# Patient Record
Sex: Male | Born: 1945
Health system: Southern US, Community
[De-identification: ages and names within clinical notes are randomized; demographics above are authoritative.]

## PROBLEM LIST (undated history)

## (undated) DIAGNOSIS — R972 Elevated prostate specific antigen [PSA]: Secondary | ICD-10-CM

## (undated) DIAGNOSIS — N400 Enlarged prostate without lower urinary tract symptoms: Secondary | ICD-10-CM

## (undated) DIAGNOSIS — Z8709 Personal history of other diseases of the respiratory system: Secondary | ICD-10-CM

## (undated) DIAGNOSIS — I2510 Atherosclerotic heart disease of native coronary artery without angina pectoris: Secondary | ICD-10-CM

## (undated) DIAGNOSIS — E785 Hyperlipidemia, unspecified: Secondary | ICD-10-CM

## (undated) DIAGNOSIS — N189 Chronic kidney disease, unspecified: Secondary | ICD-10-CM

## (undated) DIAGNOSIS — R7989 Other specified abnormal findings of blood chemistry: Secondary | ICD-10-CM

## (undated) DIAGNOSIS — I10 Essential (primary) hypertension: Secondary | ICD-10-CM

## (undated) DIAGNOSIS — N183 Chronic kidney disease, stage 3 unspecified: Secondary | ICD-10-CM

## (undated) DIAGNOSIS — I639 Cerebral infarction, unspecified: Secondary | ICD-10-CM

## (undated) HISTORY — DX: Chronic kidney disease, unspecified: N18.9

## (undated) HISTORY — DX: Cerebral infarction, unspecified: I63.9

## (undated) HISTORY — DX: Essential (primary) hypertension: I10

## (undated) HISTORY — DX: Other specified abnormal findings of blood chemistry: R79.89

## (undated) HISTORY — DX: Chronic kidney disease, stage 3 unspecified: N18.30

## (undated) HISTORY — DX: Atherosclerotic heart disease of native coronary artery without angina pectoris: I25.10

## (undated) HISTORY — PX: CORONARY ARTERY BYPASS GRAFT: SHX141

## (undated) HISTORY — DX: Elevated prostate specific antigen (PSA): R97.20

## (undated) HISTORY — DX: Hyperlipidemia, unspecified: E78.5

## (undated) HISTORY — DX: Personal history of other diseases of the respiratory system: Z87.09

## (undated) HISTORY — DX: Benign prostatic hyperplasia without lower urinary tract symptoms: N40.0

---

## 2009-02-08 HISTORY — PX: OTHER SURGICAL HISTORY: SHX169

## 2009-11-06 ENCOUNTER — Ambulatory Visit: Payer: Self-pay | Admitting: Thoracic Surgery (Cardiothoracic Vascular Surgery)

## 2009-11-06 ENCOUNTER — Inpatient Hospital Stay (HOSPITAL_COMMUNITY)
Admission: AD | Admit: 2009-11-06 | Discharge: 2009-11-11 | Payer: Self-pay | Source: Home / Self Care | Admitting: Cardiology

## 2009-11-06 ENCOUNTER — Encounter: Payer: Self-pay | Admitting: Cardiology

## 2009-11-06 ENCOUNTER — Encounter: Payer: Self-pay | Admitting: Thoracic Surgery (Cardiothoracic Vascular Surgery)

## 2009-11-06 ENCOUNTER — Ambulatory Visit: Payer: Self-pay | Admitting: Cardiology

## 2009-11-06 DIAGNOSIS — I10 Essential (primary) hypertension: Secondary | ICD-10-CM | POA: Insufficient documentation

## 2009-11-06 DIAGNOSIS — R079 Chest pain, unspecified: Secondary | ICD-10-CM | POA: Insufficient documentation

## 2009-11-06 DIAGNOSIS — F17200 Nicotine dependence, unspecified, uncomplicated: Secondary | ICD-10-CM | POA: Insufficient documentation

## 2009-11-06 DIAGNOSIS — E785 Hyperlipidemia, unspecified: Secondary | ICD-10-CM | POA: Insufficient documentation

## 2009-11-06 HISTORY — DX: Chest pain, unspecified: R07.9

## 2009-12-01 ENCOUNTER — Ambulatory Visit: Payer: Self-pay | Admitting: Thoracic Surgery (Cardiothoracic Vascular Surgery)

## 2009-12-01 ENCOUNTER — Encounter
Admission: RE | Admit: 2009-12-01 | Discharge: 2009-12-01 | Payer: Self-pay | Admitting: Thoracic Surgery (Cardiothoracic Vascular Surgery)

## 2009-12-01 ENCOUNTER — Encounter: Payer: Self-pay | Admitting: Cardiology

## 2009-12-09 ENCOUNTER — Ambulatory Visit: Payer: Self-pay | Admitting: Cardiology

## 2009-12-09 DIAGNOSIS — R5383 Other fatigue: Secondary | ICD-10-CM | POA: Insufficient documentation

## 2009-12-09 DIAGNOSIS — I25118 Atherosclerotic heart disease of native coronary artery with other forms of angina pectoris: Secondary | ICD-10-CM | POA: Insufficient documentation

## 2009-12-09 DIAGNOSIS — I2510 Atherosclerotic heart disease of native coronary artery without angina pectoris: Secondary | ICD-10-CM | POA: Insufficient documentation

## 2009-12-09 HISTORY — DX: Other fatigue: R53.83

## 2009-12-10 ENCOUNTER — Encounter: Payer: Self-pay | Admitting: Cardiology

## 2009-12-12 ENCOUNTER — Ambulatory Visit: Payer: Self-pay | Admitting: Internal Medicine

## 2009-12-17 ENCOUNTER — Encounter: Payer: Self-pay | Admitting: Cardiology

## 2009-12-17 ENCOUNTER — Ambulatory Visit: Payer: Self-pay | Admitting: Cardiology

## 2009-12-23 LAB — CONVERTED CEMR LAB
ALT: 31 units/L (ref 0–53)
AST: 24 units/L (ref 0–37)
Albumin: 4.2 g/dL (ref 3.5–5.2)
Alkaline Phosphatase: 136 units/L — ABNORMAL HIGH (ref 39–117)
BUN: 20 mg/dL (ref 6–23)
Bilirubin, Direct: 0.1 mg/dL (ref 0.0–0.3)
CO2: 25 meq/L (ref 19–32)
Calcium: 9.5 mg/dL (ref 8.4–10.5)
Chloride: 104 meq/L (ref 96–112)
Creatinine, Ser: 1.06 mg/dL (ref 0.40–1.50)
Glucose, Bld: 72 mg/dL (ref 70–99)
Indirect Bilirubin: 0.6 mg/dL (ref 0.0–0.9)
Potassium: 4.3 meq/L (ref 3.5–5.3)
Sodium: 141 meq/L (ref 135–145)
Total Bilirubin: 0.7 mg/dL (ref 0.3–1.2)
Total CK: 36 units/L (ref 7–232)
Total Protein: 7.5 g/dL (ref 6.0–8.3)

## 2010-01-09 ENCOUNTER — Ambulatory Visit: Payer: Self-pay | Admitting: Cardiovascular Disease

## 2010-01-12 ENCOUNTER — Ambulatory Visit: Payer: Self-pay | Admitting: Cardiovascular Disease

## 2010-01-16 LAB — CONVERTED CEMR LAB
ALT: 22 units/L (ref 0–53)
AST: 19 units/L (ref 0–37)
Albumin: 4.2 g/dL (ref 3.5–5.2)
Alkaline Phosphatase: 129 units/L — ABNORMAL HIGH (ref 39–117)
Bilirubin, Direct: 0.1 mg/dL (ref 0.0–0.3)
Cholesterol: 234 mg/dL — ABNORMAL HIGH (ref 0–200)
HDL: 39 mg/dL — ABNORMAL LOW (ref >39)
Indirect Bilirubin: 0.4 mg/dL (ref 0.0–0.9)
LDL Cholesterol: 148 mg/dL — ABNORMAL HIGH (ref 0–99)
Total Bilirubin: 0.5 mg/dL (ref 0.3–1.2)
Total CHOL/HDL Ratio: 6
Total Protein: 7.7 g/dL (ref 6.0–8.3)
Triglycerides: 234 mg/dL — ABNORMAL HIGH (ref <150)
VLDL: 47 mg/dL — ABNORMAL HIGH (ref 0–40)

## 2010-03-09 ENCOUNTER — Telehealth: Payer: Self-pay | Admitting: Cardiovascular Disease

## 2010-03-10 NOTE — Letter (Signed)
Summary: [facility] Office Visit   [facility] Office Visit   Imported By: Roderic Ovens 12/17/2009 16:20:45  _____________________________________________________________________  External Attachment:    Type:   Image     Comment:   External Document

## 2010-03-10 NOTE — Assessment & Plan Note (Signed)
Summary: Nurse visit/ BP increased  Nurse Visit   Vital Signs:  Patient profile:   65 year old male Height:      70 inches Weight:      197 pounds BMI:     28.37 BP sitting:   150 / 82  (left arm) Cuff size:   regular  Vitals Entered By: Bishop Dublin, CMA (December 12, 2009 2:16 PM)  Past History:  Past Medical History: Last updated: 12/09/2009 CAD HYPERLIPIDEMIA ESSENTIAL HYPERTENSION, BENIGN  TOBACCO ABUSE   Past Surgical History: Last updated: 12/09/2009 11/07/09 - Median sternotomy for coronary artery bypass grafting x3  (left internal mammary artery to distal left anterior descending  coronary artery, saphenous vein graft to first obtuse marginal branch of  the left circumflex coronary artery, saphenous vein graft to distal  right coronary artery, endoscopic saphenous vein harvest from right  thigh).  SURGEON:  Salvatore Decent. Cornelius Moras, MD  ASSISTANT:  Doree Fudge, PA ANESTHESIA:  Quita Skye. Krista Blue, MD      Family History: Last updated: 11/06/2009 Althzheimers Mother with MI in early 49s  Social History: Last updated: 12/09/2009 Retired  Married  Tobacco Use - Yes (1 to 2 packs per day for 30 years) now discontinued Alcohol Use - no Regular Exercise - yes Drug Use - no  Risk Factors: Exercise: yes (11/06/2009)  Risk Factors: Smoking Status: current (11/06/2009)   Visit Type:  Nurse visit  CC:  Bp increased.   Current Medications (verified): 1)  Lisinopril 40 Mg Tabs (Lisinopril) .... Take One Tablet By Mouth Daily 2)  Metoprolol Tartrate 25 Mg Tabs (Metoprolol Tartrate) .... Take One Tablet By Mouth Twice A Day 3)  Aspirin Ec 325 Mg Tbec (Aspirin) .... Take One Tablet By Mouth Daily  Allergies (verified): No Known Drug Allergies  Appended Document: Nurse visit/ BP increased add amlodipine 2.5qd  Appended Document: Nurse visit/ BP increased    Clinical Lists Changes  Medications: Added new medication of AMLODIPINE BESYLATE 2.5 MG TABS  (AMLODIPINE BESYLATE) Take one tablet by mouth daily - Signed Rx of AMLODIPINE BESYLATE 2.5 MG TABS (AMLODIPINE BESYLATE) Take one tablet by mouth daily;  #30 x 3;  Signed;  Entered by: Benedict Needy, RN;  Authorized by: Dolores Patty, MD, [facility];  Method used: Electronically to [facility] [address], [place], [place], [place]  [address], Ph: [phone], Fax: [phone]    Prescriptions: AMLODIPINE BESYLATE 2.5 MG TABS (AMLODIPINE BESYLATE) Take one tablet by mouth daily  #30 x 3   Entered by:   Benedict Needy, RN   Authorized by:   Dolores Patty, MD, [facility]   Signed by:   Benedict Needy, RN on 12/15/2009   Method used:   Electronically to        [facility] [address] (retail)       [address]       [place], [place]  [address]       Ph: [phone]       Fax: [phone]   RxID:   [id number]   pt aware of medication changes

## 2010-03-10 NOTE — Assessment & Plan Note (Signed)
Summary: FORMER PT OF [facility] 2 MONTH ROV   Visit Type:  Follow-up Primary Provider:  none  CC:  Denies SOB, chest pain, and or palpitations. c/o fatigue and "crazy dreams".  History of Present Illness: 65 year old male seen September of 2011 for evaluation of chest pain, cath showing severe coronary artery disease including left main disease with normal ejection fraction,  coronary artery bypass graft on 11/07/09 (left internal mammary artery to distal left anterior descending coronary artery, saphenous vein graft to first obtuse marginal branch of the left circumflex coronary artery, saphenous vein graft to distal right coronary artery) who presents for routine follow up.  this is the first time that I had met Logan [name], and we spent some time talking about his history, recent surgery and how he has recovered. He is very active, likes to use the least blower. His wife is very worried about him overexerting himself given that he is only 2 months post surgery. He is insistent on using his pulse during the lower though reports that he watches himself closely and does not overdo it.  He denies any significant chest pain. He is currently not on a cholesterol medication. He reports that in the past his cholesterol has been typically good.  EKG shows normal sinus rhythm with rate of 67 beats per minute, no significant ST or T wave changes   Current Medications (verified): 1)  Lisinopril 40 Mg Tabs (Lisinopril) .... Take One Tablet By Mouth Daily 2)  Metoprolol Tartrate 25 Mg Tabs (Metoprolol Tartrate) .... Take One Tablet Two Times A Day 3)  Aspirin Ec 325 Mg Tbec (Aspirin) .... Take One Tablet By Mouth Daily 4)  Amlodipine Besylate 2.5 Mg Tabs (Amlodipine Besylate) .... Take One Tablet By Mouth Daily  Allergies (verified): No Known Drug Allergies  Review of Systems  The patient denies fever, weight loss, weight gain, vision loss, decreased hearing, hoarseness, chest pain, syncope, dyspnea  on exertion, peripheral edema, prolonged cough, abdominal pain, incontinence, muscle weakness, depression, and enlarged lymph nodes.    Vital Signs:  Patient profile:   65 year old male Height:      70 inches Weight:      192.75 pounds BMI:     27.76 Pulse rate:   67 / minute BP sitting:   144 / 78  (left arm) Cuff size:   regular  Vitals Entered By: Bishop Dublin, CMA (January 09, 2010 10:55 AM)  Physical Exam  General:  Well developed, well nourished, in no acute distress. Head:  normocephalic and atraumatic Neck:  Neck supple, no JVD. No masses, thyromegaly or abnormal cervical nodes. Chest Wall:  well-healed sternotomy site Lungs:  Clear bilaterally to auscultation and percussion. Heart:  Non-displaced PMI, chest non-tender; regular rate and rhythm, S1, S2 without murmurs, rubs or gallops. Carotid upstroke normal, no bruit. no widened aortic pulsation.  Pedals normal pulses. No edema, no varicosities. Abdomen:  Bowel sounds positive; abdomen soft and non-tender without masses Msk:  Back normal, normal gait. Muscle strength and tone normal. Pulses:  pulses normal in all 4 extremities Extremities:  No clubbing or cyanosis. Neurologic:  Alert and oriented x 3. Skin:  Intact without lesions or rashes. Psych:  Normal affect.   Impression & Recommendations:  Problem # 1:  CAD (ICD-414.00) doing well post bypass with no symptoms of shortness of breath or chest discomfort. We have encouraged him to continue his activity though within limitations with no heavy lifting at this time.  His updated medication  list for this problem includes:    Lisinopril 40 Mg Tabs (Lisinopril) .Marland Kitchen... Take one tablet by mouth daily    Metoprolol Tartrate 25 Mg Tabs (Metoprolol tartrate) .Marland Kitchen... Take one tablet two times a day    Aspirin Ec 325 Mg Tbec (Aspirin) .Marland Kitchen... Take one tablet by mouth daily    Amlodipine Besylate 2.5 Mg Tabs (Amlodipine besylate) .Marland Kitchen... Take one tablet by mouth daily  Problem #  2:  HYPERLIPIDEMIA (ICD-272.4) We have suggested that he check his cholesterol next week. We will start a statin once we have these results. Goal LDL is less than 70. We spent some time talking about cholesterol and maintaining patent grafts and reducing his risk of peripheral vascular disease.  Problem # 3:  ESSENTIAL HYPERTENSION, BENIGN (ICD-401.1) we'll continue him on his current medication regimen We discussed each medication with him and the benefits.  His updated medication list for this problem includes:    Lisinopril 40 Mg Tabs (Lisinopril) .Marland Kitchen... Take one tablet by mouth daily    Metoprolol Tartrate 25 Mg Tabs (Metoprolol tartrate) .Marland Kitchen... Take one tablet two times a day    Aspirin Ec 325 Mg Tbec (Aspirin) .Marland Kitchen... Take one tablet by mouth daily    Amlodipine Besylate 2.5 Mg Tabs (Amlodipine besylate) .Marland Kitchen... Take one tablet by mouth daily  Problem # 4:  TOBACCO ABUSE (ICD-305.1) Previous history of smoking. Currently not smoking  Patient Instructions: 1)  Your physician recommends that you schedule a follow-up appointment in: 6 month 2)  Your physician recommends that you return for a FASTING lipid profile: 1 week(lipid) then repeat 2-3 months (Lipid/LFT) 3)  Your physician recommends that you continue on your current medications as directed. Please refer to the Current Medication list given to you today. Prescriptions: METOPROLOL TARTRATE 25 MG TABS (METOPROLOL TARTRATE) Take one tablet two times a day  #180 x 3   Entered by:   Cloyde Reams RN   Authorized by:   Dossie Arbour MD   Signed by:   Cloyde Reams RN on 01/09/2010   Method used:   Faxed to ...       [facility] (mail-order)             , [place]         Ph: [phone]       Fax: [phone]   RxID:   [id number] LISINOPRIL 40 MG TABS (LISINOPRIL) Take one tablet by mouth daily  #90 x 3   Entered by:   Cloyde Reams RN   Authorized by:   Dossie Arbour MD   Signed by:   Cloyde Reams RN on 01/09/2010   Method used:   Faxed to  ...       [facility] (mail-order)             , [place]         Ph: [phone]       Fax: [phone]   RxID:   [id number]

## 2010-03-10 NOTE — Assessment & Plan Note (Signed)
Summary: eph/ gd   Primary Provider:  none   History of Present Illness: 65 year old male I saw in September of 2011 for evaluation of chest pain. His symptoms were classic for her new-onset angina. He underwent cardiac catheterization and was found to have severe coronary artery disease including left main disease. His ejection fraction was normal. He therefore underwent coronary artery bypass graft on 11/07/09 (left internal mammary artery to distal left anterior descending coronary artery, saphenous vein graft to first obtuse marginal branch of the left circumflex coronary artery, saphenous vein graft to distal right coronary artery). Since discharge the patient denies any dyspnea on exertion, orthopnea, PND, pedal edema, palpitations, syncope or chest pain. He has noticed some fatigue and some pain in his left shoulder and neck that has now resolved.   Current Medications (verified): 1)  Lisinopril 10 Mg Tabs (Lisinopril) .... Take One Tablet By Mouth Daily 2)  Metoprolol Tartrate 50 Mg Tabs (Metoprolol Tartrate) .... Take One Tablet By Mouth Twice A Day 3)  Crestor 20 Mg Tabs (Rosuvastatin Calcium) .... Take One Tablet By Mouth Daily. 4)  Aspirin Ec 325 Mg Tbec (Aspirin) .... Take One Tablet By Mouth Daily  Allergies: No Known Drug Allergies  Past History:  Past Medical History: CAD HYPERLIPIDEMIA ESSENTIAL HYPERTENSION, BENIGN  TOBACCO ABUSE   Past Surgical History: 11/07/09 - Median sternotomy for coronary artery bypass grafting x3  (left internal mammary artery to distal left anterior descending  coronary artery, saphenous vein graft to first obtuse marginal branch of  the left circumflex coronary artery, saphenous vein graft to distal  right coronary artery, endoscopic saphenous vein harvest from right  thigh).  SURGEON:  Salvatore Decent. Cornelius Moras, MD  ASSISTANT:  Doree Fudge, PA ANESTHESIA:  Quita Skye. Krista Blue, MD      Social History: Reviewed history from 11/06/2009 and no changes  required. Retired  Married  Tobacco Use - Yes (1 to 2 packs per day for 30 years) now discontinued Alcohol Use - no Regular Exercise - yes Drug Use - no  Review of Systems       Fatigue and Pain in Left Shoulder and Neck but no fevers or chills, productive cough, hemoptysis, dysphasia, odynophagia, melena, hematochezia, dysuria, hematuria, rash, seizure activity, orthopnea, PND, pedal edema, claudication. Remaining systems are negative.   Vital Signs:  Patient profile:   65 year old male Height:      70 inches Weight:      199 pounds BMI:     28.66 Pulse rate:   67 / minute Resp:     14 per minute BP sitting:   139 / 84  (left arm)  Vitals Entered By: Kem Parkinson (December 09, 2009 9:37 AM)  Physical Exam  General:  Well-developed well-nourished in no acute distress.  Skin is warm and dry.  HEENT is normal.  Neck is supple. No thyromegaly.  Chest is clear to auscultation with normal expansion. Sternotomy without evidence of infection. Cardiovascular exam is regular rate and rhythm.  Abdominal exam nontender or distended. No masses palpated. Extremities show no edema. neuro grossly intact    Impression & Recommendations:  Problem # 1:  FATIGUE (ICD-780.79) Question contribution from beta blocker. Decrease metoprolol to 25 mg p.o. b.i.d. If his symptoms persist I will wean this off to see if his fatigue improves.  Problem # 2:  CAD (ICD-414.00)  Continue aspirin, ACE inhibitor and statin. Continue risk factor modification. His updated medication list for this problem includes:  Lisinopril 40 Mg Tabs (Lisinopril) .Marland Kitchen... Take one tablet by mouth daily    Metoprolol Tartrate 25 Mg Tabs (Metoprolol tartrate) .Marland Kitchen... Take one tablet by mouth twice a day    Aspirin Ec 325 Mg Tbec (Aspirin) .Marland Kitchen... Take one tablet by mouth daily  His updated medication list for this problem includes:    Lisinopril 10 Mg Tabs (Lisinopril) .Marland Kitchen... Take one tablet by mouth daily     Metoprolol Tartrate 50 Mg Tabs (Metoprolol tartrate) .Marland Kitchen... Take one tablet by mouth twice a day    Aspirin Ec 325 Mg Tbec (Aspirin) .Marland Kitchen... Take one tablet by mouth daily  Problem # 3:  HYPERLIPIDEMIA (ICD-272.4)  His updated medication list for this problem includes:    Crestor 20 Mg Tabs (Rosuvastatin calcium) .Marland Kitchen... Take one tablet by mouth daily.    Lovastatin 10 Mg Tabs (Lovastatin) .Marland Kitchen... Take one tablet by mouth daily at bedtime  His updated medication list for this problem includes:    Crestor 20 Mg Tabs (Rosuvastatin calcium) .Marland Kitchen... Take one tablet by mouth daily.  Problem # 4:  ESSENTIAL HYPERTENSION, BENIGN (ICD-401.1)  Blood pressure elevated. Decrease metoprolol for fatigue. Increase lisinopril to 40 mg p.o. daily. Check potassium and renal function in one week. His updated medication list for this problem includes:    Lisinopril 10 Mg Tabs (Lisinopril) .Marland Kitchen... Take one tablet by mouth daily    Metoprolol Tartrate 50 Mg Tabs (Metoprolol tartrate) .Marland Kitchen... Take one tablet by mouth twice a day    Aspirin Ec 325 Mg Tbec (Aspirin) .Marland Kitchen... Take one tablet by mouth daily  His updated medication list for this problem includes:    Lisinopril 40 Mg Tabs (Lisinopril) .Marland Kitchen... Take one tablet by mouth daily    Metoprolol Tartrate 25 Mg Tabs (Metoprolol tartrate) .Marland Kitchen... Take one tablet by mouth twice a day    Aspirin Ec 325 Mg Tbec (Aspirin) .Marland Kitchen... Take one tablet by mouth daily  Problem # 5:  TOBACCO ABUSE (ICD-305.1) Now resolved.  Patient Instructions: 1)  Your physician recommends that you schedule a follow-up appointment in: 8 weeks with Dr. Dossie Arbour 2)  Your physician recommends that you return for a FASTING lipid profile, liver, bmp, ck in 1 week in [place] 3)  Your physician has recommended you make the following change in your medication:  Prescriptions: CRESTOR 20 MG TABS (ROSUVASTATIN CALCIUM) Take one tablet by mouth daily.  #30 x 11   Entered by:   Lisabeth Devoid RN   Authorized by:    Ferman Hamming, MD, [facility]   Signed by:   Lisabeth Devoid RN on 12/09/2009   Method used:   Electronically to        [facility] (retail)       [address]       [place], [place]  [address]       Ph: [phone]       Fax: [phone]   RxID:   [id number] METOPROLOL TARTRATE 25 MG TABS (METOPROLOL TARTRATE) Take one tablet by mouth twice a day  #60 x 6   Entered by:   Lisabeth Devoid RN   Authorized by:   Ferman Hamming, MD, [facility]   Signed by:   Lisabeth Devoid RN on 12/09/2009   Method used:   Electronically to        [facility] [address] (retail)       [address]  [place], [place]  [address]       Ph: [phone]       Fax: [phone]   RxID:   [id number] LISINOPRIL 40 MG TABS (LISINOPRIL) Take one tablet by mouth daily  #30 x 6   Entered by:   Lisabeth Devoid RN   Authorized by:   Ferman Hamming, MD, [facility]   Signed by:   Lisabeth Devoid RN on 12/09/2009   Method used:   Electronically to        [facility] [address] (retail)       [address]       [place], [place]  [address]       Ph: [phone]       Fax: [phone]   RxID:   [id number]   Appended Document: eph/ gd    Clinical Lists Changes  Medications: Removed medication of LOVASTATIN 10 MG TABS (LOVASTATIN) Take one tablet by mouth daily at bedtime

## 2010-03-10 NOTE — Miscellaneous (Signed)
Summary: [facility]    [facility]   Imported By: Roderic Ovens 12/29/2009 15:12:40  _____________________________________________________________________  External Attachment:    Type:   Image     Comment:   External Document

## 2010-03-10 NOTE — Consult Note (Signed)
Summary: [facility]    [facility]   Imported By: Roderic Ovens 11/25/2009 15:13:17  _____________________________________________________________________  External Attachment:    Type:   Image     Comment:   External Document

## 2010-03-10 NOTE — Assessment & Plan Note (Signed)
Summary: NP6/ APPT IS 9A.M./ PT IS RETIRED FROM STATE/ [place]   Visit Type:  Initial Consult Primary Provider:  none  CC:  chest tightness with exertion.  History of Present Illness: 65 year old male for evaluation of chest pain. No prior cardiac history. Over the past one week the patient states he develops bilateral shoulder, posterior neck and arm pain with ambulation relieved with rest. There is associated shortness of breath and diaphoresis but no nausea or vomiting. He also notices this while riding his bicycle up a hill that improves with leveling off. He has had no symptoms at rest. Because of the above he presented for further evaluation. His last episode of pain was yesterday.  Preventive Screening-Counseling & Management  Alcohol-Tobacco     Smoking Status: current  Caffeine-Diet-Exercise     Does Patient Exercise: yes      Drug Use:  no.    Current Medications (verified): 1)  None  Allergies (verified): No Known Drug Allergies  Past History:  Past Medical History: Hyperlipidemia  Past Surgical History: none  Family History: Reviewed history and no changes required. Althzheimers Mother with MI in early 42s  Social History: Reviewed history and no changes required. Retired  Married  Tobacco Use - Yes (1 to 2 packs per day for 30 years) Alcohol Use - no Regular Exercise - yes Drug Use - no Smoking Status:  current Does Patient Exercise:  yes Drug Use:  no  Review of Systems       no fevers or chills, productive cough, hemoptysis, dysphasia, odynophagia, melena, hematochezia, dysuria, hematuria, rash, seizure activity, orthopnea, PND, pedal edema, claudication. Remaining systems are negative.   Vital Signs:  Patient profile:   65 year old male Height:      70 inches Weight:      207 pounds BMI:     29.81 Pulse rate:   72 / minute BP sitting:   158 / 96  (left arm) Cuff size:   regular  Vitals Entered By: Hardin Negus, RMA (November 06, 2009  9:03 AM)  Physical Exam  General:  Well developed/well nourished in NAD Skin warm/dry Patient not depressed No peripheral clubbing Back-normal HEENT-normal/normal eyelids Neck supple/normal carotid upstroke bilaterally; no bruits; no JVD; no thyromegaly chest - CTA/ normal expansion CV - RRR/normal S1 and S2; no murmurs, rubs or gallops;  PMI nondisplaced Abdomen -NT/ND, no HSM, no mass, + bowel sounds, soft bruit 2+ femoral pulses, no bruits Ext-no edema, chords, 2+ DP Neuro-grossly nonfocal     Impression & Recommendations:  Problem # 1:  CHEST PAIN UNSPECIFIED (ICD-786.50) Patient's symptoms are classic for angina. They are new in onset and he had an episode yesterday. Aspirin, beta blocker and statin. Proceed with cardiac catheterization. The risks and benefits including but not limited to myocardial infarction, CVA and death discussed and patient agrees to proceed.  Problem # 2:  TOBACCO ABUSE (ICD-305.1) Patient counseled on discontinuing. We'll also schedule abdominal ultrasound to rule out aneurysm.  Problem # 3:  ESSENTIAL HYPERTENSION, BENIGN (ICD-401.1) Add Lopressor 25 mg p.o. b.i.d. and follow.  Problem # 4:  HYPERLIPIDEMIA (ICD-272.4) History of hyperlipidemia. Given probable coronary disease begin statin. Check lipids and liver in 6 weeks.  Appended Document: NP6/ APPT IS 9A.M./ PT IS RETIRED FROM STATE/ [place] electrocardiogram shows sinus rhythm at a rate of 72 with no ST changes.

## 2010-03-12 NOTE — Cardiovascular Report (Signed)
Summary: Cath/Percutaneous Orders   Cath/Percutaneous Orders   Imported By: Roderic Ovens 01/28/2010 13:48:53  _____________________________________________________________________  External Attachment:    Type:   Image     Comment:   External Document

## 2010-03-18 NOTE — Progress Notes (Signed)
Summary: RX  Phone Note Refill Request Call back at Home Phone [phone] Message from:  Patient on March 09, 2010 3:34 PM  Refills Requested: Medication #1:  AMLODIPINE BESYLATE 2.5 MG TABS Take one tablet by mouth daily [facility]  Initial call taken by: Harlon Flor,  March 09, 2010 3:34 PM    Prescriptions: AMLODIPINE BESYLATE 2.5 MG TABS (AMLODIPINE BESYLATE) Take one tablet by mouth daily  #90 x 3   Entered by:   Bishop Dublin, CMA   Authorized by:   Dossie Arbour MD   Signed by:   Bishop Dublin, CMA on 03/09/2010   Method used:   Faxed to ...       [facility] (mail-order)             , [place]         Ph: [phone]       Fax: [phone]   RxID:   [id number]

## 2010-04-14 ENCOUNTER — Encounter: Payer: Self-pay | Admitting: Cardiovascular Disease

## 2010-04-14 ENCOUNTER — Other Ambulatory Visit (INDEPENDENT_AMBULATORY_CARE_PROVIDER_SITE_OTHER): Payer: Medicare Other

## 2010-04-14 DIAGNOSIS — E785 Hyperlipidemia, unspecified: Secondary | ICD-10-CM

## 2010-04-15 LAB — CONVERTED CEMR LAB
ALT: 23 units/L (ref 0–53)
AST: 18 units/L (ref 0–37)
Albumin: 4.3 g/dL (ref 3.5–5.2)
Alkaline Phosphatase: 119 units/L — ABNORMAL HIGH (ref 39–117)
Bilirubin, Direct: 0.1 mg/dL (ref 0.0–0.3)
Cholesterol: 213 mg/dL — ABNORMAL HIGH (ref 0–200)
HDL: 36 mg/dL — ABNORMAL LOW (ref >39)
Indirect Bilirubin: 0.4 mg/dL (ref 0.0–0.9)
LDL Cholesterol: 127 mg/dL — ABNORMAL HIGH (ref 0–99)
Total Bilirubin: 0.5 mg/dL (ref 0.3–1.2)
Total CHOL/HDL Ratio: 5.9
Total Protein: 7.2 g/dL (ref 6.0–8.3)
Triglycerides: 252 mg/dL — ABNORMAL HIGH (ref <150)
VLDL: 50 mg/dL — ABNORMAL HIGH (ref 0–40)

## 2010-04-23 LAB — BLOOD GAS, ARTERIAL
Acid-Base Excess: 1 mmol/L (ref 0.0–2.0)
Bicarbonate: 25.2 mEq/L — ABNORMAL HIGH (ref 20.0–24.0)
Patient temperature: 98.6
TCO2: 26.4 mmol/L (ref 0–100)

## 2010-04-23 LAB — CBC
HCT: 32 % — ABNORMAL LOW (ref 39.0–52.0)
HCT: 33.3 % — ABNORMAL LOW (ref 39.0–52.0)
HCT: 33.7 % — ABNORMAL LOW (ref 39.0–52.0)
HCT: 33.9 % — ABNORMAL LOW (ref 39.0–52.0)
HCT: 37.4 % — ABNORMAL LOW (ref 39.0–52.0)
HCT: 34.5 % — ABNORMAL LOW (ref 39.0–52.0)
HCT: 40.8 % (ref 39.0–52.0)
HCT: 45 % (ref 39.0–52.0)
Hemoglobin: 10.6 g/dL — ABNORMAL LOW (ref 13.0–17.0)
Hemoglobin: 10.8 g/dL — ABNORMAL LOW (ref 13.0–17.0)
Hemoglobin: 10.9 g/dL — ABNORMAL LOW (ref 13.0–17.0)
Hemoglobin: 11.2 g/dL — ABNORMAL LOW (ref 13.0–17.0)
Hemoglobin: 11.5 g/dL — ABNORMAL LOW (ref 13.0–17.0)
MCH: 29.2 pg (ref 26.0–34.0)
MCH: 29.8 pg (ref 26.0–34.0)
MCH: 30.2 pg (ref 26.0–34.0)
MCH: 30.4 pg (ref 26.0–34.0)
MCH: 29.9 pg (ref 26.0–34.0)
MCH: 30 pg (ref 26.0–34.0)
MCH: 30.1 pg (ref 26.0–34.0)
MCHC: 32.2 g/dL (ref 30.0–36.0)
MCHC: 32.4 g/dL (ref 30.0–36.0)
MCHC: 32.9 g/dL (ref 30.0–36.0)
MCHC: 33.1 g/dL (ref 30.0–36.0)
MCHC: 33.2 g/dL (ref 30.0–36.0)
MCHC: 33.3 g/dL (ref 30.0–36.0)
MCHC: 33.8 g/dL (ref 30.0–36.0)
MCV: 90.9 fL (ref 78.0–100.0)
MCV: 91.2 fL (ref 78.0–100.0)
MCV: 91.3 fL (ref 78.0–100.0)
MCV: 91.9 fL (ref 78.0–100.0)
MCV: 92 fL (ref 78.0–100.0)
MCV: 90.4 fL (ref 78.0–100.0)
MCV: 91.7 fL (ref 78.0–100.0)
Platelets: 143 10*3/uL — ABNORMAL LOW (ref 150–400)
Platelets: 144 10*3/uL — ABNORMAL LOW (ref 150–400)
Platelets: 148 10*3/uL — ABNORMAL LOW (ref 150–400)
Platelets: 162 10*3/uL (ref 150–400)
Platelets: 152 10*3/uL (ref 150–400)
Platelets: 222 10*3/uL (ref 150–400)
RBC: 3.51 MIL/uL — ABNORMAL LOW (ref 4.22–5.81)
RBC: 3.62 MIL/uL — ABNORMAL LOW (ref 4.22–5.81)
RBC: 3.69 MIL/uL — ABNORMAL LOW (ref 4.22–5.81)
RBC: 3.73 MIL/uL — ABNORMAL LOW (ref 4.22–5.81)
RBC: 3.83 MIL/uL — ABNORMAL LOW (ref 4.22–5.81)
RBC: 4.45 MIL/uL (ref 4.22–5.81)
RDW: 13.9 % (ref 11.5–15.5)
RDW: 13.9 % (ref 11.5–15.5)
RDW: 14 % (ref 11.5–15.5)
RDW: 14 % (ref 11.5–15.5)
RDW: 14.1 % (ref 11.5–15.5)
RDW: 13.6 % (ref 11.5–15.5)
RDW: 13.9 % (ref 11.5–15.5)
WBC: 11.2 10*3/uL — ABNORMAL HIGH (ref 4.0–10.5)
WBC: 11.6 10*3/uL — ABNORMAL HIGH (ref 4.0–10.5)
WBC: 11.8 10*3/uL — ABNORMAL HIGH (ref 4.0–10.5)
WBC: 11.9 10*3/uL — ABNORMAL HIGH (ref 4.0–10.5)
WBC: 13.6 10*3/uL — ABNORMAL HIGH (ref 4.0–10.5)
WBC: 12.7 10*3/uL — ABNORMAL HIGH (ref 4.0–10.5)

## 2010-04-23 LAB — POCT I-STAT 3, ART BLOOD GAS (G3+)
Acid-base deficit: 2 mmol/L (ref 0.0–2.0)
Bicarbonate: 24.3 mEq/L — ABNORMAL HIGH (ref 20.0–24.0)
Bicarbonate: 24.7 mEq/L — ABNORMAL HIGH (ref 20.0–24.0)
O2 Saturation: 100 %
O2 Saturation: 99 %
Patient temperature: 35.9
Patient temperature: 36.9
Patient temperature: 37.3
TCO2: 24 mmol/L (ref 0–100)
TCO2: 25 mmol/L (ref 0–100)
TCO2: 26 mmol/L (ref 0–100)
TCO2: 26 mmol/L (ref 0–100)
pCO2 arterial: 43.8 mmHg (ref 35.0–45.0)
pH, Arterial: 7.274 — ABNORMAL LOW (ref 7.350–7.450)
pH, Arterial: 7.283 — ABNORMAL LOW (ref 7.350–7.450)
pH, Arterial: 7.346 — ABNORMAL LOW (ref 7.350–7.450)
pO2, Arterial: 129 mmHg — ABNORMAL HIGH (ref 80.0–100.0)
pO2, Arterial: 65 mmHg — ABNORMAL LOW (ref 80.0–100.0)

## 2010-04-23 LAB — URINALYSIS, MICROSCOPIC ONLY
Glucose, UA: NEGATIVE mg/dL
Ketones, ur: NEGATIVE mg/dL
Nitrite: NEGATIVE
Protein, ur: NEGATIVE mg/dL
Urobilinogen, UA: 1 mg/dL (ref 0.0–1.0)

## 2010-04-23 LAB — CREATININE, SERUM
Creatinine, Ser: 1.22 mg/dL (ref 0.4–1.5)
GFR calc Af Amer: >60 mL/min (ref >60)
GFR calc non Af Amer: 60 mL/min — ABNORMAL LOW (ref >60)
GFR calc non Af Amer: >60 mL/min (ref >60)

## 2010-04-23 LAB — BASIC METABOLIC PANEL
BUN: 14 mg/dL (ref 6–23)
BUN: 14 mg/dL (ref 6–23)
BUN: 16 mg/dL (ref 6–23)
BUN: 12 mg/dL (ref 6–23)
BUN: 13 mg/dL (ref 6–23)
CO2: 27 mEq/L (ref 19–32)
CO2: 27 mEq/L (ref 19–32)
CO2: 30 mEq/L (ref 19–32)
CO2: 28 mEq/L (ref 19–32)
Calcium: 7.6 mg/dL — ABNORMAL LOW (ref 8.4–10.5)
Calcium: 8.1 mg/dL — ABNORMAL LOW (ref 8.4–10.5)
Calcium: 8.4 mg/dL (ref 8.4–10.5)
Chloride: 100 mEq/L (ref 96–112)
Chloride: 106 mEq/L (ref 96–112)
Chloride: 111 mEq/L (ref 96–112)
Chloride: 107 mEq/L (ref 96–112)
Creatinine, Ser: 1.17 mg/dL (ref 0.4–1.5)
Creatinine, Ser: 1.18 mg/dL (ref 0.4–1.5)
Creatinine, Ser: 1.28 mg/dL (ref 0.4–1.5)
Creatinine, Ser: 1.14 mg/dL (ref 0.4–1.5)
GFR calc Af Amer: >60 mL/min (ref >60)
GFR calc Af Amer: >60 mL/min (ref >60)
GFR calc Af Amer: >60 mL/min (ref >60)
GFR calc Af Amer: >60 mL/min (ref >60)
GFR calc non Af Amer: 56 mL/min — ABNORMAL LOW (ref >60)
GFR calc non Af Amer: >60 mL/min (ref >60)
GFR calc non Af Amer: >60 mL/min (ref >60)
GFR calc non Af Amer: >60 mL/min (ref >60)
Glucose, Bld: 110 mg/dL — ABNORMAL HIGH (ref 70–99)
Glucose, Bld: 116 mg/dL — ABNORMAL HIGH (ref 70–99)
Glucose, Bld: 123 mg/dL — ABNORMAL HIGH (ref 70–99)
Glucose, Bld: 97 mg/dL (ref 70–99)
Potassium: 3.7 mEq/L (ref 3.5–5.1)
Potassium: 4.2 mEq/L (ref 3.5–5.1)
Potassium: 4.6 mEq/L (ref 3.5–5.1)
Potassium: 4.1 mEq/L (ref 3.5–5.1)
Potassium: 4.3 mEq/L (ref 3.5–5.1)
Sodium: 137 mEq/L (ref 135–145)
Sodium: 140 mEq/L (ref 135–145)
Sodium: 142 mEq/L (ref 135–145)

## 2010-04-23 LAB — GLUCOSE, CAPILLARY
Glucose-Capillary: 101 mg/dL — ABNORMAL HIGH (ref 70–99)
Glucose-Capillary: 115 mg/dL — ABNORMAL HIGH (ref 70–99)
Glucose-Capillary: 76 mg/dL (ref 70–99)
Glucose-Capillary: 87 mg/dL (ref 70–99)
Glucose-Capillary: 91 mg/dL (ref 70–99)

## 2010-04-23 LAB — POCT I-STAT 4, (NA,K, GLUC, HGB,HCT)
Glucose, Bld: 101 mg/dL — ABNORMAL HIGH (ref 70–99)
Glucose, Bld: 103 mg/dL — ABNORMAL HIGH (ref 70–99)
Glucose, Bld: 92 mg/dL (ref 70–99)
HCT: 30 % — ABNORMAL LOW (ref 39.0–52.0)
HCT: 34 % — ABNORMAL LOW (ref 39.0–52.0)
HCT: 39 % (ref 39.0–52.0)
Hemoglobin: 11.2 g/dL — ABNORMAL LOW (ref 13.0–17.0)
Hemoglobin: 13.3 g/dL (ref 13.0–17.0)
Hemoglobin: 9.5 g/dL — ABNORMAL LOW (ref 13.0–17.0)
Potassium: 4.2 mEq/L (ref 3.5–5.1)
Potassium: 4.4 mEq/L (ref 3.5–5.1)
Potassium: 4.9 mEq/L (ref 3.5–5.1)
Sodium: 135 mEq/L (ref 135–145)
Sodium: 138 mEq/L (ref 135–145)
Sodium: 139 mEq/L (ref 135–145)
Sodium: 140 mEq/L (ref 135–145)
Sodium: 142 mEq/L (ref 135–145)

## 2010-04-23 LAB — URINALYSIS, ROUTINE W REFLEX MICROSCOPIC
Bilirubin Urine: NEGATIVE
Glucose, UA: NEGATIVE mg/dL
Ketones, ur: NEGATIVE mg/dL
Leukocytes, UA: NEGATIVE
Nitrite: NEGATIVE
Protein, ur: 30 mg/dL — AB
Specific Gravity, Urine: 1.013 (ref 1.005–1.030)
Urobilinogen, UA: 1 mg/dL (ref 0.0–1.0)
pH: 6.5 (ref 5.0–8.0)

## 2010-04-23 LAB — COMPREHENSIVE METABOLIC PANEL
Alkaline Phosphatase: 101 U/L (ref 39–117)
BUN: 11 mg/dL (ref 6–23)
CO2: 27 mEq/L (ref 19–32)
Chloride: 106 mEq/L (ref 96–112)
GFR calc non Af Amer: >60 mL/min (ref >60)
Glucose, Bld: 85 mg/dL (ref 70–99)
Potassium: 4.2 mEq/L (ref 3.5–5.1)
Total Bilirubin: 0.8 mg/dL (ref 0.3–1.2)

## 2010-04-23 LAB — POCT I-STAT, CHEM 8
BUN: 18 mg/dL (ref 6–23)
BUN: 13 mg/dL (ref 6–23)
Calcium, Ion: 1.12 mmol/L (ref 1.12–1.32)
Chloride: 105 mEq/L (ref 96–112)
Chloride: 109 mEq/L (ref 96–112)
Creatinine, Ser: 1.2 mg/dL (ref 0.4–1.5)
Creatinine, Ser: 1.2 mg/dL (ref 0.4–1.5)
Glucose, Bld: 108 mg/dL — ABNORMAL HIGH (ref 70–99)
Glucose, Bld: 115 mg/dL — ABNORMAL HIGH (ref 70–99)
HCT: 35 % — ABNORMAL LOW (ref 39.0–52.0)
Hemoglobin: 11.9 g/dL — ABNORMAL LOW (ref 13.0–17.0)
Potassium: 4 mEq/L (ref 3.5–5.1)
Potassium: 4.7 mEq/L (ref 3.5–5.1)
Sodium: 140 mEq/L (ref 135–145)
TCO2: 27 mmol/L (ref 0–100)

## 2010-04-23 LAB — URINE CULTURE
Colony Count: NO GROWTH
Culture  Setup Time: 201110030933
Culture: NO GROWTH

## 2010-04-23 LAB — LIPID PANEL
HDL: 28 mg/dL — ABNORMAL LOW (ref >39)
LDL Cholesterol: 114 mg/dL — ABNORMAL HIGH (ref 0–99)
Triglycerides: 287 mg/dL — ABNORMAL HIGH (ref <150)
VLDL: 57 mg/dL — ABNORMAL HIGH (ref 0–40)
VLDL: 67 mg/dL — ABNORMAL HIGH (ref 0–40)

## 2010-04-23 LAB — URINE MICROSCOPIC-ADD ON

## 2010-04-23 LAB — HEMOGLOBIN A1C
Hgb A1c MFr Bld: 5.6 % (ref <5.7)
Mean Plasma Glucose: 114 mg/dL (ref <117)

## 2010-04-23 LAB — MAGNESIUM
Magnesium: 2.5 mg/dL (ref 1.5–2.5)
Magnesium: 2.7 mg/dL — ABNORMAL HIGH (ref 1.5–2.5)

## 2010-04-23 LAB — MRSA PCR SCREENING: MRSA by PCR: NEGATIVE

## 2010-04-23 LAB — TYPE AND SCREEN

## 2010-04-23 LAB — TSH: TSH: 3.27 u[IU]/mL (ref 0.350–4.500)

## 2010-04-23 LAB — HEMOGLOBIN AND HEMATOCRIT, BLOOD: Hemoglobin: 9.8 g/dL — ABNORMAL LOW (ref 13.0–17.0)

## 2010-04-23 LAB — PROTIME-INR
INR: 0.93 (ref 0.00–1.49)
Prothrombin Time: 12.7 seconds (ref 11.6–15.2)
Prothrombin Time: 16 seconds — ABNORMAL HIGH (ref 11.6–15.2)

## 2010-06-23 ENCOUNTER — Encounter: Payer: Self-pay | Admitting: Emergency Medicine

## 2010-06-23 NOTE — Assessment & Plan Note (Signed)
OFFICE VISIT   SHAYON, TROMPETER  DOB:  1945/02/14                                        December 01, 2009  CHART #:  [id number]   HISTORY OF PRESENT ILLNESS:  The patient returns for a routine followup  status post coronary artery bypass grafting x3 on November 07, 2009.  His postoperative recovery has been uncomplicated.  Since hospital  discharge, she has continued to do quite well.  He is no longer taking  any sort of oral narcotic pain relievers.  He states he has very mild  residual soreness in his chest and sensation of hypersensitivity of the  skin on the anterior chest wall.  He is finally starting to sleep better  at night.  He has been sleeping in a recliner and he still finds this to  work the best.  He has not had any shortness of breath.  In fact, he is  already walking much better than he had been prior to surgery.  He is  not smoking.  Overall, he feels quite well and has no complaints.   MEDICATIONS:  Remain unchanged from the time of hospital discharge.   PHYSICAL EXAMINATION:  General:  Well-appearing male.  Vital Signs:  Blood pressure 118/75, pulse 78 and regular, oxygen saturation 98% on  room air.  Chest:  Median sternotomy incision that is healing nicely.  The sternum is stable on palpation.  Auscultation reveals clear breath  sounds that are symmetrical bilaterally.  No wheezes, rales or rhonchi  are noted.  Cardiovascular:  Regular rate and rhythm.  No murmurs, rubs  or gallops are appreciated.  Abdomen:  Soft, nontender.  Extremities:  Warm and well perfused.  There is no lower extremity edema.   DIAGNOSTICS TEST:  Chest x-ray performed today at the [facility] is reviewed.  This reveals clear lung fields bilaterally.  There  are no pleural effusions.  All the sternal wires appear intact.  No  other abnormalities are noted.   IMPRESSION:  Excellent progress following recent coronary artery bypass  grafting.   PLAN:  I have encouraged the patient to continue to gradually increase  his physical activity as tolerated with his primary limitation at this  point remaining that he refrain from heavy lifting or strenuous use of  his arms or shoulders for at least another 2 months.  I have encouraged  him get started in cardiac rehab program.  I do think it is probably  reasonable for him to go ahead start driving an automobile.  I have  suggested that he do so only short distances during the daylight  initially and then gradually increase from there.  All of his questions  have been addressed.  The patient's plan is to follow up with Dr.  Jens Som early next week.  He will return to see [place] here in the future  only should further problems or difficulties arise.   Salvatore Decent. Cornelius Moras, M.D.  Electronically Signed   CHO/MEDQ  D:  12/01/2009  T:  12/02/2009  Job:  [id number]   cc:   Madolyn Frieze. Jens Som, MD, [facility]  Bruce R. Juanda Chance, MD, [facility]

## 2010-06-30 ENCOUNTER — Telehealth: Payer: Self-pay | Admitting: Cardiovascular Disease

## 2010-06-30 NOTE — Telephone Encounter (Signed)
Attempted to call pt to schedule a f/u with [facility] in June.  Phone number has been disconnected.

## 2010-07-01 NOTE — Telephone Encounter (Signed)
Can we resend a letter?

## 2010-11-13 ENCOUNTER — Telehealth: Payer: Self-pay | Admitting: Cardiovascular Disease

## 2010-11-13 DIAGNOSIS — R609 Edema, unspecified: Secondary | ICD-10-CM

## 2010-11-13 NOTE — Telephone Encounter (Signed)
Patient came in office stating that over the past year he has gone from 185 to 230 pounds.  He thinks this is water weight and needs Lasix.  He said water weight is in his belly and it jiggles not hard like fat per patient.  He also would like labs to be ordered to test his kidney function.  He has stopped his statin and amalodopine since last office visit due to joint pains.  Would like for RN to call him to discuss.

## 2010-11-13 NOTE — Telephone Encounter (Signed)
The patient has an appointment scheduled for November 17, 2010 @ 11:15. The patient does not have any shortness of breath.  He has stopped the amlodipine for one month now because of swelling but the swelling is still present. He also stopped the atorvastatin due to cramps.

## 2010-11-15 NOTE — Telephone Encounter (Signed)
Would order bmp and bnp asap

## 2010-11-16 ENCOUNTER — Other Ambulatory Visit: Payer: Self-pay | Admitting: Cardiovascular Disease

## 2010-11-16 ENCOUNTER — Ambulatory Visit (INDEPENDENT_AMBULATORY_CARE_PROVIDER_SITE_OTHER): Payer: Medicare Other | Admitting: *Deleted

## 2010-11-16 DIAGNOSIS — R0602 Shortness of breath: Secondary | ICD-10-CM

## 2010-11-16 DIAGNOSIS — R609 Edema, unspecified: Secondary | ICD-10-CM

## 2010-11-16 LAB — BASIC METABOLIC PANEL
Chloride: 104 mEq/L (ref 96–112)
Creat: 1.44 mg/dL — ABNORMAL HIGH (ref 0.50–1.35)
Potassium: 4.7 mEq/L (ref 3.5–5.3)

## 2010-11-16 NOTE — Telephone Encounter (Signed)
Spoke to pt, he will come in today (around 2-3pm) for BMP and BNP, and will f/u with TG tomorrow as scheduled.

## 2010-11-17 ENCOUNTER — Ambulatory Visit (INDEPENDENT_AMBULATORY_CARE_PROVIDER_SITE_OTHER): Payer: Medicare Other | Admitting: Cardiovascular Disease

## 2010-11-17 ENCOUNTER — Encounter: Payer: Self-pay | Admitting: Cardiovascular Disease

## 2010-11-17 DIAGNOSIS — E785 Hyperlipidemia, unspecified: Secondary | ICD-10-CM

## 2010-11-17 DIAGNOSIS — R5381 Other malaise: Secondary | ICD-10-CM

## 2010-11-17 DIAGNOSIS — F17200 Nicotine dependence, unspecified, uncomplicated: Secondary | ICD-10-CM

## 2010-11-17 DIAGNOSIS — E669 Obesity, unspecified: Secondary | ICD-10-CM

## 2010-11-17 DIAGNOSIS — I2510 Atherosclerotic heart disease of native coronary artery without angina pectoris: Secondary | ICD-10-CM

## 2010-11-17 DIAGNOSIS — I10 Essential (primary) hypertension: Secondary | ICD-10-CM

## 2010-11-17 LAB — BRAIN NATRIURETIC PEPTIDE: Brain Natriuretic Peptide: 8.1 pg/mL (ref 0.0–100.0)

## 2010-11-17 NOTE — Assessment & Plan Note (Signed)
He stopped smoking after the surgery. We have complemented him on this. 

## 2010-11-17 NOTE — Assessment & Plan Note (Signed)
He is refusing to take any cholesterol medication. We have suggested he lose weight and try red yeast rice

## 2010-11-17 NOTE — Patient Instructions (Addendum)
You are doing well. No medication changes were made. Please continue to monitor your blood pressure We would like the top number less than 140, bottom number less than 90 Please try red yeast rice 2 to 4 a day Please call us if you have new issues that need to be addressed before your next appt.  We will call you for a follow up Appt. In 6 months

## 2010-11-17 NOTE — Assessment & Plan Note (Signed)
He does have fatigue though feels better he reports after stopping several of his medications. Uncertain if he has sleep apnea.

## 2010-11-17 NOTE — Progress Notes (Signed)
Patient ID: Logan [name], male    DOB: [birth year], 65 y.o.   MRN: [id number]  HPI Comments: 65 year old male seen September of 2011 for evaluation of chest pain, cath showing severe coronary artery disease including left main disease with normal ejection fraction,  coronary artery bypass graft on 11/07/09 (left internal mammary artery to distal left anterior descending coronary artery, saphenous vein graft to first obtuse marginal branch of the left circumflex coronary artery, saphenous vein graft to distal right coronary artery) who presents for routine follow up.   He reports that he has been dosing his medications on his own. He stopped his amlodipine as he felt this was causing insomnia. He has been taking his metoprolol once a day at noon. He reports that his blood pressure is typically well controlled with systolic pressures in the 130 range over 80s. His weight is going up and has climbed from 185 after surgery to 227. He initially felt it was from fluid and was wondering if he needed a diuretic pill. We did lab work at his baseline metabolic panel and BNP came back essentially normal  He reports that he has been eating a gallon of ice cream in a day frequently with fried chicken.   He denies any significant chest pain. He is currently not on a cholesterol medication. He reports that in the past his cholesterol has been typically good.   EKG shows normal sinus rhythm with rate of 79 beats per minute, no significant ST or T wave changes     Outpatient Encounter Prescriptions as of 11/17/2010  Medication Sig Dispense Refill  . aspirin 81 MG tablet Take 81 mg by mouth daily.        Marland Kitchen lisinopril (PRINIVIL,ZESTRIL) 40 MG tablet Take 40 mg by mouth daily.  (takes 20 mg BID0      . metoprolol tartrate (LOPRESSOR) 25 MG tablet Take 25 mg by mouth 2 (two) times daily.  (only taking once a day)          Review of Systems  Constitutional: Positive for unexpected weight change.  HENT:  Negative.   Eyes: Negative.   Respiratory: Negative.   Cardiovascular: Negative.   Gastrointestinal: Negative.   Musculoskeletal: Negative.   Skin: Negative.   Neurological: Negative.   Hematological: Negative.   Psychiatric/Behavioral: Negative.   All other systems reviewed and are negative.    BP 140/100  Pulse 79  Ht 5\' 10"  (1.778 m)  Wt 226 lb (102.513 kg)  BMI 32.43 kg/m2  Physical Exam  Nursing note and vitals reviewed. Constitutional: He is oriented to person, place, and time. He appears well-developed and well-nourished.       obese  HENT:  Head: Normocephalic.  Nose: Nose normal.  Mouth/Throat: Oropharynx is clear and moist.  Eyes: Conjunctivae are normal. Pupils are equal, round, and reactive to light.  Neck: Normal range of motion. Neck supple. No JVD present.  Cardiovascular: Normal rate, regular rhythm, S1 normal, S2 normal, normal heart sounds and intact distal pulses.  Exam reveals no gallop and no friction rub.   No murmur heard. Pulmonary/Chest: Effort normal and breath sounds normal. No respiratory distress. He has no wheezes. He has no rales. He exhibits no tenderness.  Abdominal: Soft. Bowel sounds are normal. He exhibits no distension. There is no tenderness.  Musculoskeletal: Normal range of motion. He exhibits no edema and no tenderness.  Lymphadenopathy:    He has no cervical adenopathy.  Neurological: He is alert and oriented to  person, place, and time. Coordination normal.  Skin: Skin is warm and dry. No rash noted. No erythema.  Psychiatric: He has a normal mood and affect. His behavior is normal. Judgment and thought content normal.           Assessment and Plan

## 2010-11-17 NOTE — Assessment & Plan Note (Signed)
Blood pressure is elevated today. He stopped his amlodipine on his own, takes only one metoprolol per day. We have instructed him to be more medication compliant. He will continue to closely monitor his blood pressure.

## 2010-11-17 NOTE — Assessment & Plan Note (Signed)
We have encouraged him to watch his weight, watch his diet and start exercising.

## 2010-11-17 NOTE — Assessment & Plan Note (Signed)
Currently with no symptoms of angina. No further workup at this time. Continue current medication regimen. 

## 2010-12-20 ENCOUNTER — Other Ambulatory Visit: Payer: Self-pay | Admitting: Cardiovascular Disease

## 2010-12-21 ENCOUNTER — Other Ambulatory Visit: Payer: Self-pay | Admitting: Cardiovascular Disease

## 2010-12-22 ENCOUNTER — Telehealth: Payer: Self-pay

## 2010-12-22 MED ORDER — LISINOPRIL 40 MG PO TABS: 40.0000 mg | ORAL_TABLET | Freq: Every day | ORAL | Status: DC

## 2010-12-22 MED ORDER — METOPROLOL TARTRATE 25 MG PO TABS: 25.0000 mg | ORAL_TABLET | Freq: Two times a day (BID) | ORAL | Status: DC

## 2010-12-22 NOTE — Telephone Encounter (Signed)
Refill sent for metoprolol.  

## 2010-12-22 NOTE — Telephone Encounter (Signed)
Refill sent for lisinopril  

## 2011-05-09 IMAGING — CR DG CHEST 2V
2 series · 2 of 2 positions shown · non-contrast
Comparison: 11/09/2009

CLINICAL DATA: Post CABG

CHEST - 2 VIEW

[view not recorded (1 of 2)]
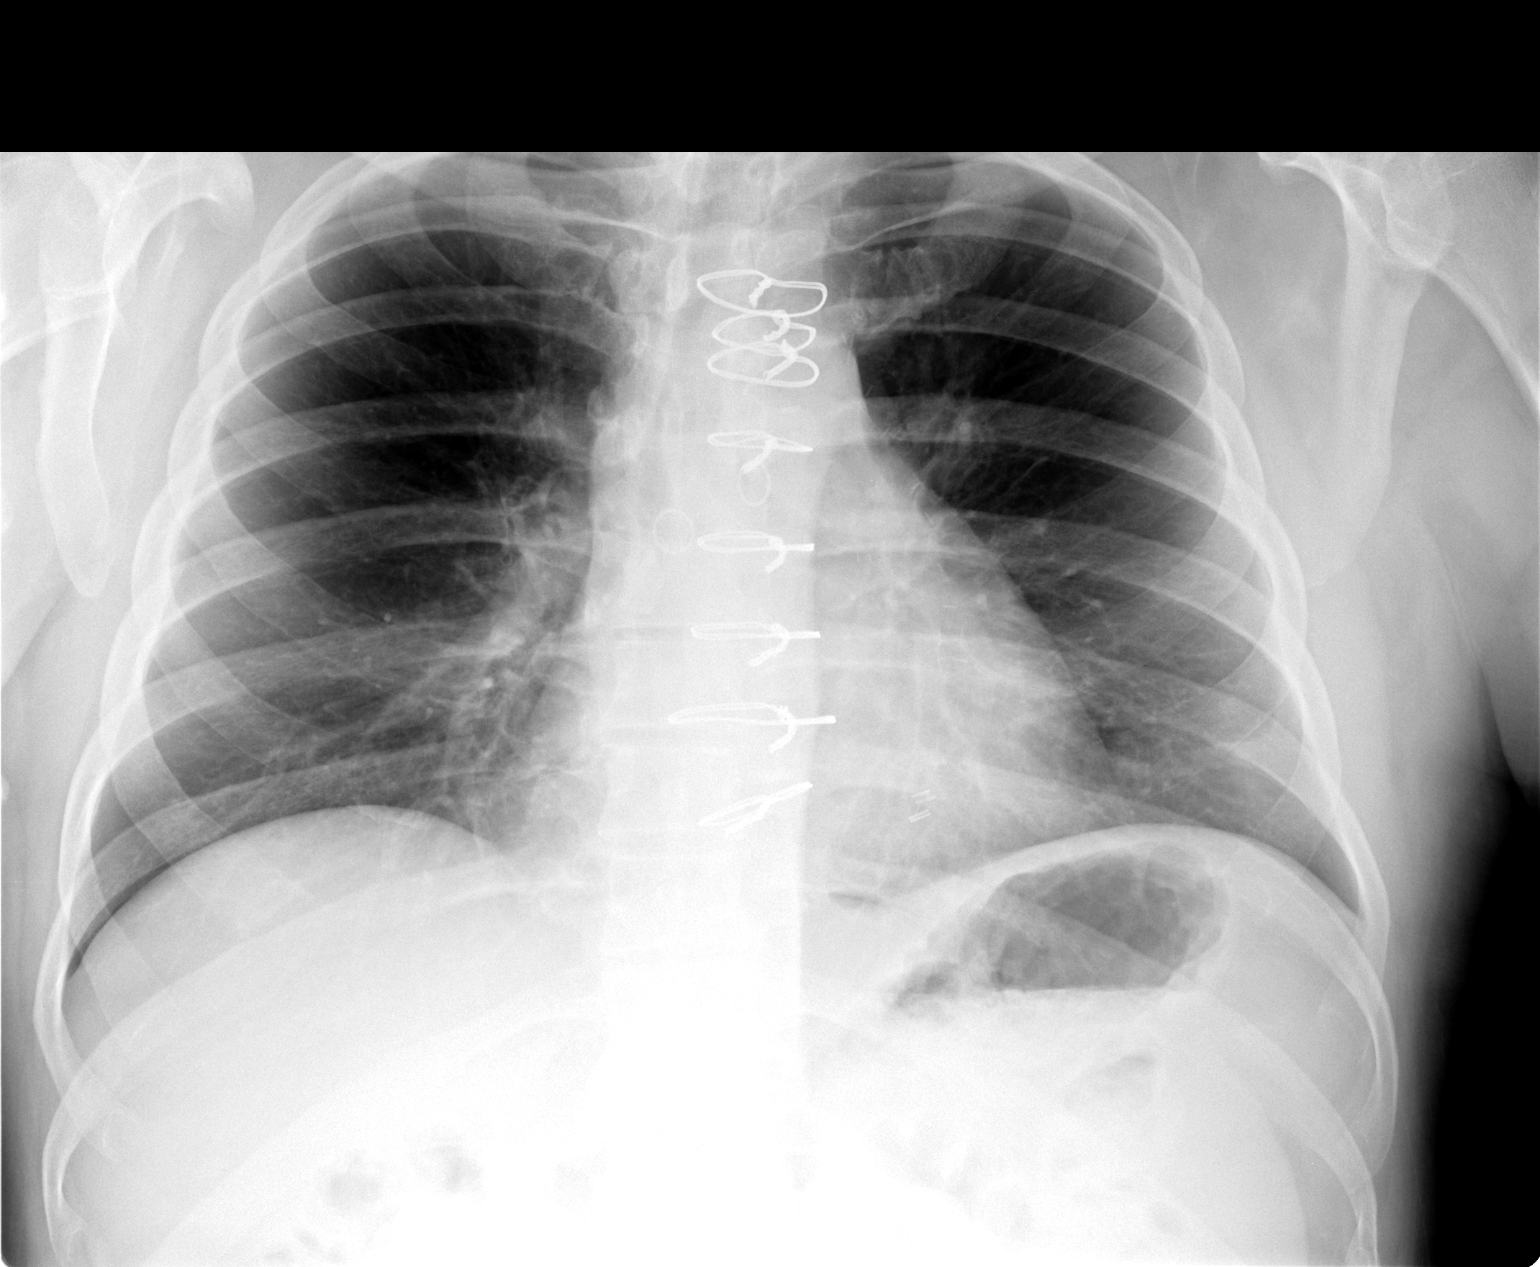

[view not recorded (2 of 2)]
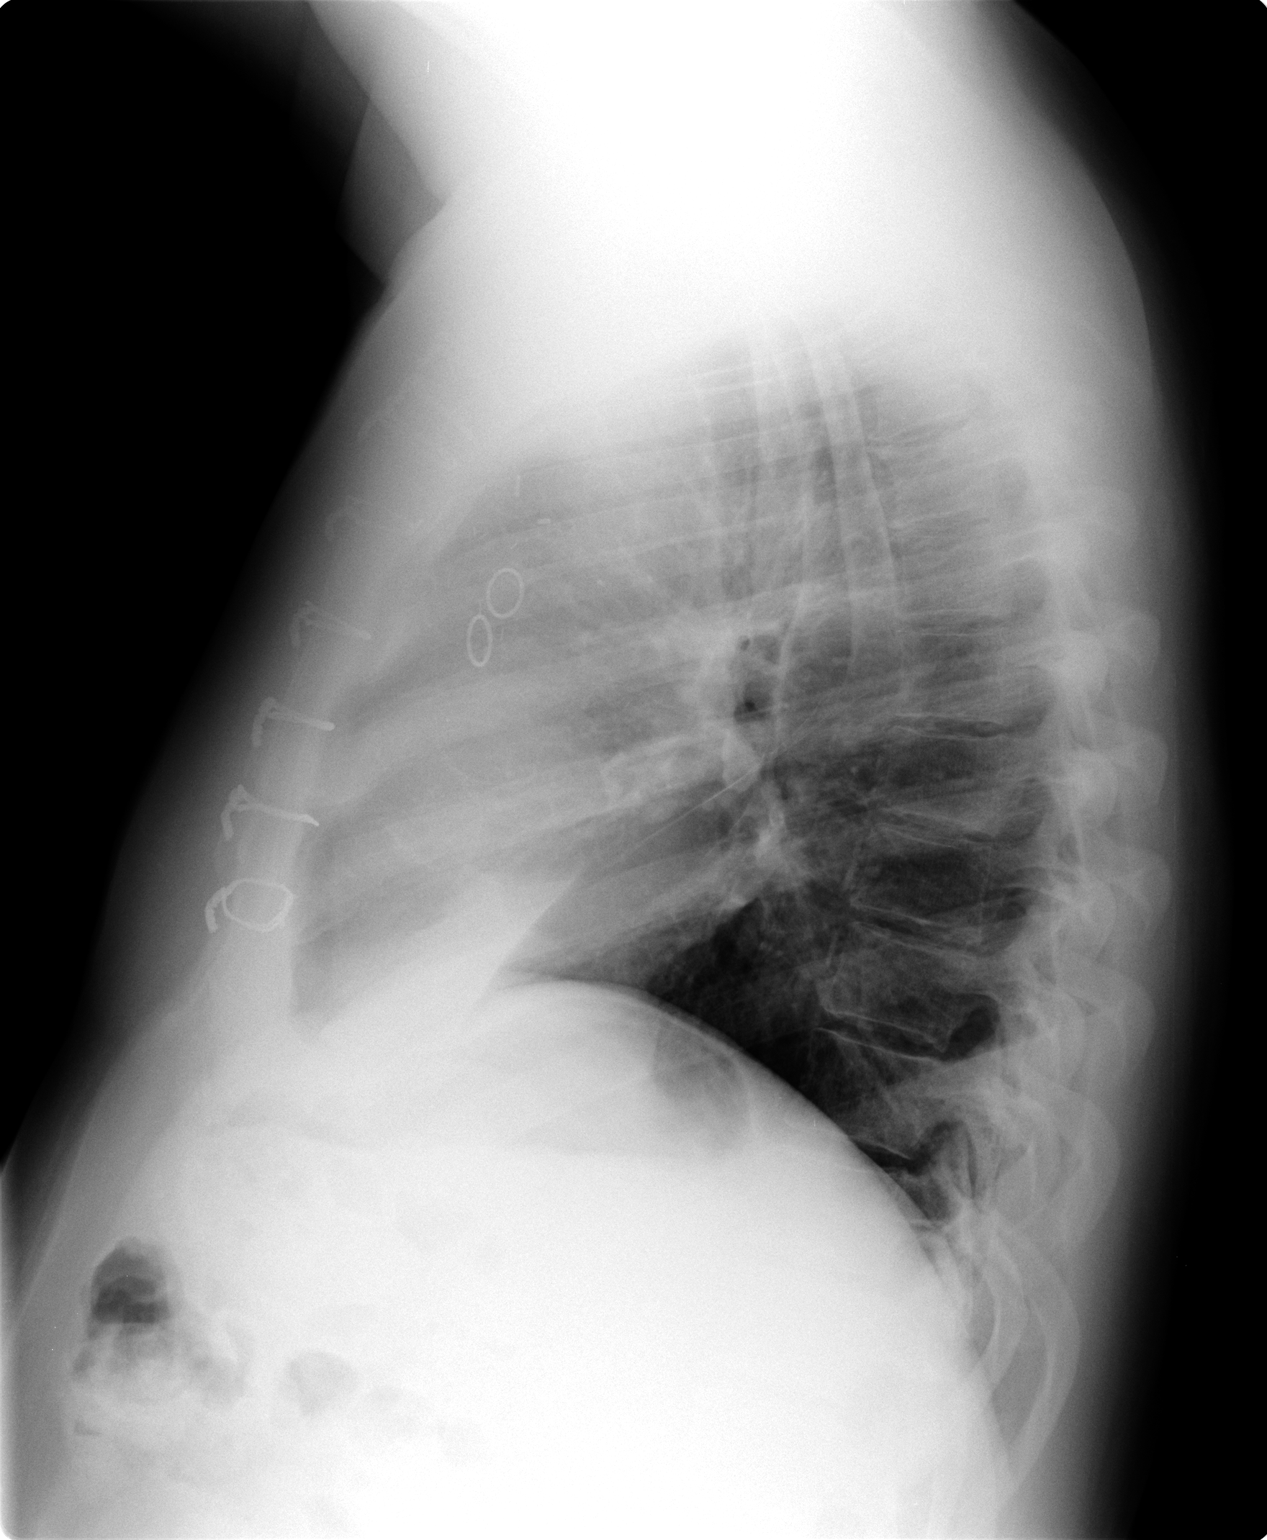

[2 of 2 positions shown; findings below may reference images not displayed]

FINDINGS: Post CABG.  Heart normal without congestive heart
failure.  Lungs normally aerated with no pleural fluid.  Osseous
structures intact.
IMPRESSION: Post coronary bypass graft - no active disease.

## 2011-09-20 ENCOUNTER — Other Ambulatory Visit: Payer: Self-pay | Admitting: Cardiovascular Disease

## 2012-07-26 ENCOUNTER — Other Ambulatory Visit: Payer: Self-pay | Admitting: *Deleted

## 2012-07-26 MED ORDER — METOPROLOL TARTRATE 25 MG PO TABS: 25.0000 mg | ORAL_TABLET | Freq: Two times a day (BID) | ORAL | Status: DC

## 2012-07-27 ENCOUNTER — Other Ambulatory Visit: Payer: Self-pay | Admitting: *Deleted

## 2012-08-01 ENCOUNTER — Other Ambulatory Visit: Payer: Self-pay

## 2012-08-01 MED ORDER — METOPROLOL TARTRATE 25 MG PO TABS: 25.0000 mg | ORAL_TABLET | Freq: Two times a day (BID) | ORAL | Status: DC

## 2012-08-01 NOTE — Telephone Encounter (Signed)
Refill sent for metoprolol.  

## 2013-03-05 ENCOUNTER — Other Ambulatory Visit: Payer: Self-pay | Admitting: Cardiovascular Disease

## 2014-04-03 ENCOUNTER — Telehealth: Payer: Self-pay | Admitting: Cardiovascular Disease

## 2014-04-03 NOTE — Telephone Encounter (Signed)
Please see note. Patient having BP issues, Thank you.

## 2014-04-03 NOTE — Telephone Encounter (Signed)
° °  1. Which medications need to be refilled? Lisinipril 40 mg daily   2. Which pharmacy is medication to be sent to? [facility] [place]   3. Do they need a 30 day or 90 day supply? No preferencce   4. Would they like a call back once the medication has been sent to the pharmacy? Please   145/85 171/100 167/100   Took metoprolol this morning but it did not help.  Patient has no other symptoms except slight headache.  Has no lisinopril left and 20 metoprolol left  Please have Mandy call patient to see if he might need to be seen soon or if he should just get a refill called in.

## 2014-04-03 NOTE — Telephone Encounter (Signed)
Spoke w/ pt.  He reports that he had tried to come off of his meds completely, but has not been feeling well recently.  He started checking his BP and noticed that it was going up, so he started back on his metoprolol, which he has about 20 left. Pt is sched to come in tomorrow @ 8:45.

## 2014-04-04 ENCOUNTER — Ambulatory Visit (INDEPENDENT_AMBULATORY_CARE_PROVIDER_SITE_OTHER): Payer: Medicare PPO | Admitting: Cardiovascular Disease

## 2014-04-04 ENCOUNTER — Encounter: Payer: Self-pay | Admitting: Cardiovascular Disease

## 2014-04-04 VITALS — BP 162/100 | HR 66 | Ht 70.0 in | Wt 245.2 lb

## 2014-04-04 DIAGNOSIS — I10 Essential (primary) hypertension: Secondary | ICD-10-CM

## 2014-04-04 DIAGNOSIS — E669 Obesity, unspecified: Secondary | ICD-10-CM

## 2014-04-04 DIAGNOSIS — I2510 Atherosclerotic heart disease of native coronary artery without angina pectoris: Secondary | ICD-10-CM

## 2014-04-04 DIAGNOSIS — E785 Hyperlipidemia, unspecified: Secondary | ICD-10-CM

## 2014-04-04 DIAGNOSIS — Z951 Presence of aortocoronary bypass graft: Secondary | ICD-10-CM | POA: Insufficient documentation

## 2014-04-04 MED ORDER — LISINOPRIL 20 MG PO TABS: 20.0000 mg | ORAL_TABLET | Freq: Two times a day (BID) | ORAL | Status: DC

## 2014-04-04 MED ORDER — METOPROLOL TARTRATE 25 MG PO TABS: 25.0000 mg | ORAL_TABLET | Freq: Two times a day (BID) | ORAL | Status: DC

## 2014-04-04 NOTE — Assessment & Plan Note (Signed)
We will restart his lisinopril 20 mg twice a day. He has indicated he would like to take this 10 mg 4 times a day. Also restart his metoprolol 25 mg twice a day. Recommended he closely monitor his blood pressure and call our office if it does not fall within appropriate range. Blood pressure guidelines given to him, systolic pressure less than 683, diastolic less than 90

## 2014-04-04 NOTE — Patient Instructions (Addendum)
Please restart lisinopril 20 mg twice a day Restart metoprolol 25 mg twice a day  Monitor your blood pressure Call the office if the blood pressure runs high  Please call us if you have new issues that need to be addressed before your next appt.  Your physician wants you to follow-up in: 6 months.  You will receive a reminder letter in the mail two months in advance. If you don't receive a letter, please call our office to schedule the follow-up appointment.

## 2014-04-04 NOTE — Assessment & Plan Note (Signed)
Discussed the importance of good blood pressure, weight, cholesterol. He does not want cholesterol medications. He denies any anginal symptoms. No further testing at this time

## 2014-04-04 NOTE — Assessment & Plan Note (Signed)
Long discussion about atherosclerosis in that he is high risk given his cholesterol levels. No recent lab work. No primary care. He does not feel that he needs any at this time

## 2014-04-04 NOTE — Progress Notes (Signed)
Patient ID: Logan [name], male    DOB: 1945-12-08, 69 y.o.   MRN: [id number]  HPI Comments: 69 year old male seen September of 2011 for evaluation of chest pain, cath showing severe coronary artery disease including left main disease with normal ejection fraction, coronary artery bypass graft on 11/07/09 (left internal mammary artery to distal left anterior descending coronary artery, saphenous vein graft to first obtuse marginal branch of the left circumflex coronary artery, saphenous vein graft to distal right coronary artery) who presents to reestablish care in the [place] office. Has not been seen since 2012.  The past he did not want a cholesterol medication Back in 2012 He stopped his amlodipine as he felt this was causing insomnia. He was taking metoprolol once a day  His weight at that time climbed from 185 after surgery to 227. He initially felt it was from fluid and was wondering if he needed a diuretic pill. We did lab work at his baseline metabolic panel and BNP came back essentially normal  In follow-up today, he has not been taking blood pressure pills on a regular basis. Has not been seen by [place] in clinic in many years. He does not have primary care. He would like to restart his lisinopril 10 mg 4 times per day, metoprolol as he was taking previously. Weight continues to trend higher, now 245. Diet continues to be poor Again he does not want a cholesterol medication  He denies any significant chest pain. Reports that he feels fine when he walks 1 mile  EKG on today's visit shows normal sinus rhythm with rate 66 bpm, no significant ST or T-wave change   Allergies  Allergen Reactions  . Amlodipine     Insomnia & joint pain  . Lipitor [Atorvastatin Calcium]     Muscle aches    Outpatient Encounter Prescriptions as of 04/04/2014  Medication Sig  . aspirin 81 MG tablet Take 81 mg by mouth daily.    Marland Kitchen lisinopril (PRINIVIL,ZESTRIL) 20 MG tablet Take 1 tablet (20 mg total)  by mouth 2 (two) times daily.  . metoprolol tartrate (LOPRESSOR) 25 MG tablet Take 1 tablet (25 mg total) by mouth 2 (two) times daily.  . [DISCONTINUED] lisinopril (PRINIVIL,ZESTRIL) 40 MG tablet TAKE 1 TABLET DAILY  . [DISCONTINUED] metoprolol tartrate (LOPRESSOR) 25 MG tablet Take 1 tablet (25 mg total) by mouth 2 (two) times daily.    Past Medical History  Diagnosis Date  . Coronary artery disease   . Hyperlipidemia   . Hypertension     Past Surgical History  Procedure Laterality Date  . Coronary artery bypass graft      Social History  reports that he has quit smoking. His smoking use included Cigarettes. He has a 45 pack-year smoking history. He has never used smokeless tobacco. He reports that he does not drink alcohol or use illicit drugs.  Family History family history includes Heart attack in his mother.  Review of Systems  Constitutional: Negative.   Respiratory: Negative.   Cardiovascular: Negative.   Gastrointestinal: Negative.   Musculoskeletal: Negative.   Skin: Negative.   Neurological: Negative.   Hematological: Negative.   Psychiatric/Behavioral: Negative.   All other systems reviewed and are negative.   BP 162/100 mmHg  Pulse 66  Ht 5\' 10"  (1.778 m)  Wt 245 lb 4 oz (111.245 kg)  BMI 35.19 kg/m2  Physical Exam  Constitutional: He is oriented to person, place, and time. He appears well-developed and well-nourished.  HENT:  Head:  Normocephalic.  Nose: Nose normal.  Mouth/Throat: Oropharynx is clear and moist.  Eyes: Conjunctivae are normal. Pupils are equal, round, and reactive to light.  Neck: Normal range of motion. Neck supple. No JVD present.  Cardiovascular: Normal rate, regular rhythm, S1 normal, S2 normal, normal heart sounds and intact distal pulses.  Exam reveals no gallop and no friction rub.   No murmur heard. Pulmonary/Chest: Effort normal and breath sounds normal. No respiratory distress. He has no wheezes. He has no rales. He exhibits  no tenderness.  Abdominal: Soft. Bowel sounds are normal. He exhibits no distension. There is no tenderness.  Musculoskeletal: Normal range of motion. He exhibits no edema or tenderness.  Lymphadenopathy:    He has no cervical adenopathy.  Neurological: He is alert and oriented to person, place, and time. Coordination normal.  Skin: Skin is warm and dry. No rash noted. No erythema.  Psychiatric: He has a normal mood and affect. His behavior is normal. Judgment and thought content normal.      Assessment and Plan   Nursing note and vitals reviewed.

## 2014-04-04 NOTE — Assessment & Plan Note (Signed)
We have encouraged continued exercise, careful diet management in an effort to lose weight. 

## 2014-04-04 NOTE — Assessment & Plan Note (Signed)
Long discussion about cholesterol medication. He is concerned about side effects and does not want a statin

## 2014-04-05 ENCOUNTER — Telehealth: Payer: Self-pay

## 2014-04-05 NOTE — Telephone Encounter (Signed)
Spoke w/ pt. He reports that his SBP has remained in the 160-170 range and DBP has not gotten below 100.  He was seen at [facility] yesterday and given metoprolol and lisinopril that was started last night.  Pt admits that he checks his BP every hour and is worrying about it.  Discussed this at length w/ pt and he is agreeable to checking his BP once day.  He reports issues w/ anxiety and asks if he can take a xanax to calm down a bit.  Advised him that I spoke w/ Dr. Rockey Situ and he agrees that this will help to bring pt's BP down, as well.  Advised pt to call me on Monday w/ 3 BP readings.   He asks if there is an emergency # he can call over the weekend, advised him to call our # and leave a message for the NP or PA on call and they will call him back. He is appreciative and will call back on Monday.

## 2014-04-05 NOTE — Telephone Encounter (Signed)
Pt called, states his BP medication is not having any affect. His BP is still is high. States the "bottom # will not go below 100"

## 2014-04-11 ENCOUNTER — Ambulatory Visit: Payer: BC Managed Care – PPO | Admitting: Cardiovascular Disease

## 2014-10-07 ENCOUNTER — Encounter: Payer: Self-pay | Admitting: Cardiovascular Disease

## 2014-10-07 ENCOUNTER — Ambulatory Visit (INDEPENDENT_AMBULATORY_CARE_PROVIDER_SITE_OTHER): Payer: Medicare PPO | Admitting: Cardiovascular Disease

## 2014-10-07 VITALS — BP 178/92 | HR 71 | Ht 70.0 in | Wt 247.2 lb

## 2014-10-07 DIAGNOSIS — E785 Hyperlipidemia, unspecified: Secondary | ICD-10-CM

## 2014-10-07 DIAGNOSIS — F17200 Nicotine dependence, unspecified, uncomplicated: Secondary | ICD-10-CM

## 2014-10-07 DIAGNOSIS — E669 Obesity, unspecified: Secondary | ICD-10-CM

## 2014-10-07 DIAGNOSIS — Z720 Tobacco use: Secondary | ICD-10-CM

## 2014-10-07 DIAGNOSIS — I2510 Atherosclerotic heart disease of native coronary artery without angina pectoris: Secondary | ICD-10-CM | POA: Diagnosis not present

## 2014-10-07 DIAGNOSIS — Z951 Presence of aortocoronary bypass graft: Secondary | ICD-10-CM

## 2014-10-07 DIAGNOSIS — I10 Essential (primary) hypertension: Secondary | ICD-10-CM

## 2014-10-07 MED ORDER — METOPROLOL TARTRATE 25 MG PO TABS: 25.0000 mg | ORAL_TABLET | Freq: Two times a day (BID) | ORAL | Status: DC

## 2014-10-07 MED ORDER — HYDROCHLOROTHIAZIDE 25 MG PO TABS: 25.0000 mg | ORAL_TABLET | Freq: Every day | ORAL | Status: DC

## 2014-10-07 MED ORDER — AMLODIPINE BESYLATE 10 MG PO TABS: 10.0000 mg | ORAL_TABLET | Freq: Every day | ORAL | Status: DC

## 2014-10-07 MED ORDER — LISINOPRIL 20 MG PO TABS: 40.0000 mg | ORAL_TABLET | Freq: Every day | ORAL | Status: DC

## 2014-10-07 NOTE — Assessment & Plan Note (Signed)
We have encouraged continued exercise, careful diet management in an effort to lose weight. 

## 2014-10-07 NOTE — Assessment & Plan Note (Signed)
Currently with no symptoms of angina. No further workup at this time. He does not want a statin

## 2014-10-07 NOTE — Assessment & Plan Note (Addendum)
Blood pressures running high. Recommended he start amlodipine 10 mg daily Previous side effects discussed with him. He does not think that it caused any problems and will retry this. He also would like HCTZ as he feels some of his blood pressure is from fluid retention. He would like to take this on a periodic basis not daily. We have recommended if he takes this on a daily basis that he call our office for basic metabolic panel checked given the problems with low potassium that can occur. This was explained to him in detail

## 2014-10-07 NOTE — Patient Instructions (Addendum)
You are doing well.  Please start amlodipine one a day Please monitor your blood pressure  Take HCTZ as needed for high blood pressure (in the morning)  Please call us if you have new issues that need to be addressed before your next appt.  Your physician wants you to follow-up in: 6 months.  You will receive a reminder letter in the mail two months in advance. If you don't receive a letter, please call our office to schedule the follow-up appointment.

## 2014-10-07 NOTE — Assessment & Plan Note (Signed)
Currently with no symptoms of angina

## 2014-10-07 NOTE — Progress Notes (Signed)
Patient ID: Logan [name], male    DOB: 1945/03/17, 69 y.o.   MRN: [id number]  HPI Comments: 69 year old male seen September of 2011 for evaluation of chest pain, cath showing severe coronary artery disease including left main disease with normal ejection fraction, coronary artery bypass graft on 11/07/09 (left internal mammary artery to distal left anterior descending coronary artery, saphenous vein graft to first obtuse marginal branch of the left circumflex coronary artery, saphenous vein graft to distal right coronary artery) who presents to for follow-up of his coronary artery disease Previously was lost to follow-up  In follow-up today, he reports that his blood pressure has been running high at home. He sometimes takes some of his wife's HCTZ. Also wonders if he can take his wife's metoprolol succinate 50 mg daily. In the past did not want a cholesterol medication. He has quit smoking. Weight continues to trend upwards. He does not want any lab work, does not have primary care Diet is poor Denies any symptoms concerning for angina  EKG on today's visit shows normal sinus rhythm with rate 71 bpm, no significant ST or T-wave changes  Other past medical history Back in 2012 He stopped his amlodipine as he felt this was causing insomnia. He was taking metoprolol once a day  His weight at that time climbed from 185 after surgery to 227. He initially felt it was from fluid and was wondering if he needed a diuretic pill. We did lab work at his baseline metabolic panel and BNP came back essentially normal      Allergies  Allergen Reactions  . Lipitor [Atorvastatin Calcium]     Muscle aches    Outpatient Encounter Prescriptions as of 10/07/2014  Medication Sig  . aspirin 81 MG tablet Take 81 mg by mouth daily.    Marland Kitchen lisinopril (PRINIVIL,ZESTRIL) 20 MG tablet Take 2 tablets (40 mg total) by mouth daily.  . metoprolol tartrate (LOPRESSOR) 25 MG tablet Take 1 tablet (25 mg total) by  mouth 2 (two) times daily.  . [DISCONTINUED] lisinopril (PRINIVIL,ZESTRIL) 20 MG tablet Take 1 tablet (20 mg total) by mouth 2 (two) times daily.  . [DISCONTINUED] metoprolol tartrate (LOPRESSOR) 25 MG tablet Take 1 tablet (25 mg total) by mouth 2 (two) times daily.  Marland Kitchen amLODipine (NORVASC) 10 MG tablet Take 1 tablet (10 mg total) by mouth daily.  . hydrochlorothiazide (HYDRODIURIL) 25 MG tablet Take 1 tablet (25 mg total) by mouth daily.   No facility-administered encounter medications on file as of 10/07/2014.    Past Medical History  Diagnosis Date  . Coronary artery disease   . Hyperlipidemia   . Hypertension     Past Surgical History  Procedure Laterality Date  . Coronary artery bypass graft      Social History  reports that he has quit smoking. His smoking use included Cigarettes. He has a 45 pack-year smoking history. He has never used smokeless tobacco. He reports that he does not drink alcohol or use illicit drugs.  Family History family history includes Heart attack in his mother.  Review of Systems  Constitutional: Negative.   Respiratory: Negative.   Cardiovascular: Negative.   Gastrointestinal: Negative.   Musculoskeletal: Negative.   Skin: Negative.   Neurological: Negative.   Hematological: Negative.   Psychiatric/Behavioral: Negative.   All other systems reviewed and are negative.   BP 178/92 mmHg  Pulse 71  Ht 5\' 10"  (1.778 m)  Wt 247 lb 4 oz (112.152 kg)  BMI 35.48 kg/m2  Physical Exam  Constitutional: He is oriented to person, place, and time. He appears well-developed and well-nourished.  Obese  HENT:  Head: Normocephalic.  Nose: Nose normal.  Mouth/Throat: Oropharynx is clear and moist.  Eyes: Conjunctivae are normal. Pupils are equal, round, and reactive to light.  Neck: Normal range of motion. Neck supple. No JVD present.  Cardiovascular: Normal rate, regular rhythm, S1 normal, S2 normal, normal heart sounds and intact distal pulses.  Exam  reveals no gallop and no friction rub.   No murmur heard. Pulmonary/Chest: Effort normal and breath sounds normal. No respiratory distress. He has no wheezes. He has no rales. He exhibits no tenderness.  Abdominal: Soft. Bowel sounds are normal. He exhibits no distension. There is no tenderness.  Musculoskeletal: Normal range of motion. He exhibits no edema or tenderness.  Lymphadenopathy:    He has no cervical adenopathy.  Neurological: He is alert and oriented to person, place, and time. Coordination normal.  Skin: Skin is warm and dry. No rash noted. No erythema.  Psychiatric: He has a normal mood and affect. His behavior is normal. Judgment and thought content normal.      Assessment and Plan   Nursing note and vitals reviewed.

## 2014-10-07 NOTE — Assessment & Plan Note (Signed)
He stopped smoking after the surgery. We have complemented him on this.

## 2014-10-07 NOTE — Assessment & Plan Note (Signed)
He does not want a statin

## 2014-12-30 ENCOUNTER — Other Ambulatory Visit: Payer: Self-pay

## 2015-04-07 ENCOUNTER — Encounter: Payer: Self-pay | Admitting: Internal Medicine

## 2015-04-08 ENCOUNTER — Ambulatory Visit: Payer: Self-pay | Admitting: Cardiovascular Disease

## 2015-04-11 ENCOUNTER — Ambulatory Visit (INDEPENDENT_AMBULATORY_CARE_PROVIDER_SITE_OTHER): Payer: Medicare Other | Admitting: Cardiovascular Disease

## 2015-04-11 ENCOUNTER — Encounter: Payer: Self-pay | Admitting: Cardiovascular Disease

## 2015-04-11 VITALS — BP 158/98 | HR 83 | Ht 70.0 in | Wt 253.2 lb

## 2015-04-11 DIAGNOSIS — E785 Hyperlipidemia, unspecified: Secondary | ICD-10-CM

## 2015-04-11 DIAGNOSIS — F17200 Nicotine dependence, unspecified, uncomplicated: Secondary | ICD-10-CM

## 2015-04-11 DIAGNOSIS — I2510 Atherosclerotic heart disease of native coronary artery without angina pectoris: Secondary | ICD-10-CM | POA: Diagnosis not present

## 2015-04-11 DIAGNOSIS — I10 Essential (primary) hypertension: Secondary | ICD-10-CM | POA: Diagnosis not present

## 2015-04-11 DIAGNOSIS — Z951 Presence of aortocoronary bypass graft: Secondary | ICD-10-CM

## 2015-04-11 HISTORY — DX: Morbid (severe) obesity due to excess calories: E66.01

## 2015-04-11 MED ORDER — LISINOPRIL 40 MG PO TABS: 40.0000 mg | ORAL_TABLET | Freq: Every day | ORAL | Status: DC

## 2015-04-11 MED ORDER — HYDROCHLOROTHIAZIDE 25 MG PO TABS: 25.0000 mg | ORAL_TABLET | Freq: Every day | ORAL | Status: DC

## 2015-04-11 NOTE — Assessment & Plan Note (Signed)
He does not want [place] to check his cholesterol level. He does not want her know. He does not have primary care Guidelines discussed with him He does not want cholesterol medication

## 2015-04-11 NOTE — Assessment & Plan Note (Addendum)
He denies any symptoms concerning for angina Declining cholesterol medication Declining even checking his cholesterol   Total encounter time more than 25 minutes  Greater than 50% was spent in counseling and coordination of care with the patient

## 2015-04-11 NOTE — Patient Instructions (Addendum)
You are doing well.  Please increase the lisinopril up to 20 mg twice a day, Stay on HCTZ  We will check labs (CMP)   Date & time: ___________________  Please call us if you have new issues that need to be addressed before your next appt.  Your physician wants you to follow-up in: 6 months.  You will receive a reminder letter in the mail two months in advance. If you don't receive a letter, please call our office to schedule the follow-up appointment.

## 2015-04-11 NOTE — Assessment & Plan Note (Signed)
We have encouraged continued exercise, careful diet management in an effort to lose weight. 

## 2015-04-11 NOTE — Progress Notes (Signed)
Patient ID: Logan [name], male    DOB: 1945/03/01, 70 y.o.   MRN: [id number]  HPI Comments: 70 year old male seen September of 2011 for evaluation of chest pain, cath showing severe coronary artery disease including left main disease with normal ejection fraction, coronary artery bypass graft on 11/07/09 (left internal mammary artery to distal left anterior descending coronary artery, saphenous vein graft to first obtuse marginal branch of the left circumflex coronary artery, saphenous vein graft to distal right coronary artery) who presents to for follow-up of his coronary artery disease Previously was lost to follow-up Previous smoker   in follow-up today, he reports that he is doing well He is unable to tolerate amlodipine as he feels this caused depression Stopped metoprolol on his own as he felt this was causing his blood pressure to run low Currently takes lisinopril 20 mg daily, HCTZ 25 mill grams daily Blood pressure typically runs high but does report if he walks a mile, then checks his blood pressure, it runs better.  She no recent lab work, does not have primary care In the past did not want a cholesterol medication Previously used to take his wife's medications including HCTZ Reports diet is poor, Denies any chest pain concerning for angina  EKG on today's visit shows normal sinus rhythm with rate 83 bpm, no significant ST or T-wave changes  Other past medical history Back in 2012 He stopped his amlodipine as he felt this was causing insomnia. He was taking metoprolol once a day  His weight at that time climbed from 185 after surgery to 227. He initially felt it was from fluid and was wondering if he needed a diuretic pill. We did lab work at his baseline metabolic panel and BNP came back essentially normal      Allergies  Allergen Reactions  . Lipitor [Atorvastatin Calcium]     Muscle aches    Outpatient Encounter Prescriptions as of 04/11/2015  Medication Sig  .  aspirin 81 MG tablet Take 81 mg by mouth daily.    . hydrochlorothiazide (HYDRODIURIL) 25 MG tablet Take 1 tablet (25 mg total) by mouth daily.  Marland Kitchen lisinopril (PRINIVIL,ZESTRIL) 40 MG tablet Take 1 tablet (40 mg total) by mouth daily.  . [DISCONTINUED] hydrochlorothiazide (HYDRODIURIL) 25 MG tablet Take 1 tablet (25 mg total) by mouth daily.  . [DISCONTINUED] lisinopril (PRINIVIL,ZESTRIL) 20 MG tablet Take 2 tablets (40 mg total) by mouth daily. (Patient taking differently: Take 10 mg by mouth 2 (two) times daily. )  . [DISCONTINUED] amLODipine (NORVASC) 10 MG tablet Take 1 tablet (10 mg total) by mouth daily. (Patient not taking: Reported on 04/11/2015)  . [DISCONTINUED] metoprolol tartrate (LOPRESSOR) 25 MG tablet Take 1 tablet (25 mg total) by mouth 2 (two) times daily. (Patient not taking: Reported on 04/11/2015)   No facility-administered encounter medications on file as of 04/11/2015.    Past Medical History  Diagnosis Date  . Coronary artery disease   . Hyperlipidemia   . Hypertension     Past Surgical History  Procedure Laterality Date  . Coronary artery bypass graft      Social History  reports that he has quit smoking. His smoking use included Cigarettes. He has a 45 pack-year smoking history. He has never used smokeless tobacco. He reports that he does not drink alcohol or use illicit drugs.  Family History family history includes Heart attack in his mother.  Review of Systems  Constitutional: Negative.   Respiratory: Negative.   Cardiovascular: Negative.  Gastrointestinal: Negative.   Musculoskeletal: Negative.   Skin: Negative.   Neurological: Negative.   Hematological: Negative.   Psychiatric/Behavioral: Negative.   All other systems reviewed and are negative.   BP 158/98 mmHg  Pulse 83  Ht 5\' 10"  (1.778 m)  Wt 253 lb 4 oz (114.873 kg)  BMI 36.34 kg/m2  Physical Exam  Constitutional: He is oriented to person, place, and time. He appears well-developed and  well-nourished.  Obese  HENT:  Head: Normocephalic.  Nose: Nose normal.  Mouth/Throat: Oropharynx is clear and moist.  Eyes: Conjunctivae are normal. Pupils are equal, round, and reactive to light.  Neck: Normal range of motion. Neck supple. No JVD present.  Cardiovascular: Normal rate, regular rhythm, S1 normal, S2 normal, normal heart sounds and intact distal pulses.  Exam reveals no gallop and no friction rub.   No murmur heard. Pulmonary/Chest: Effort normal and breath sounds normal. No respiratory distress. He has no wheezes. He has no rales. He exhibits no tenderness.  Abdominal: Soft. Bowel sounds are normal. He exhibits no distension. There is no tenderness.  Musculoskeletal: Normal range of motion. He exhibits no edema or tenderness.  Lymphadenopathy:    He has no cervical adenopathy.  Neurological: He is alert and oriented to person, place, and time. Coordination normal.  Skin: Skin is warm and dry. No rash noted. No erythema.  Psychiatric: He has a normal mood and affect. His behavior is normal. Judgment and thought content normal.      Assessment and Plan   Nursing note and vitals reviewed.

## 2015-04-11 NOTE — Assessment & Plan Note (Signed)
He would like to continue taking lisinopril and HCTZ We have recommended that he increase lisinopril up to 40 mg daily with HCTZ 25 mg daily if blood pressure is running high at home. New prescription sent in to the pharmacy He does not want amlodipine Recommended if blood pressure does run high even on the above medications that he had low-dose metoprolol twice a day

## 2015-04-14 ENCOUNTER — Other Ambulatory Visit (INDEPENDENT_AMBULATORY_CARE_PROVIDER_SITE_OTHER): Payer: Medicare Other | Admitting: *Deleted

## 2015-04-14 DIAGNOSIS — I10 Essential (primary) hypertension: Secondary | ICD-10-CM

## 2015-04-14 DIAGNOSIS — I2510 Atherosclerotic heart disease of native coronary artery without angina pectoris: Secondary | ICD-10-CM | POA: Diagnosis not present

## 2015-04-15 LAB — COMPREHENSIVE METABOLIC PANEL
ALK PHOS: 116 IU/L (ref 39–117)
ALT: 23 IU/L (ref 0–44)
AST: 26 IU/L (ref 0–40)
Albumin/Globulin Ratio: 1.2 (ref 1.1–2.5)
Albumin: 4 g/dL (ref 3.5–4.8)
BUN/Creatinine Ratio: 20 (ref 10–22)
BUN: 30 mg/dL — AB (ref 8–27)
Bilirubin Total: 0.4 mg/dL (ref 0.0–1.2)
CALCIUM: 9.3 mg/dL (ref 8.6–10.2)
CO2: 24 mmol/L (ref 18–29)
CREATININE: 1.53 mg/dL — AB (ref 0.76–1.27)
Chloride: 99 mmol/L (ref 96–106)
GFR calc Af Amer: 52 mL/min/{1.73_m2} — ABNORMAL LOW (ref >59)
GFR, EST NON AFRICAN AMERICAN: 45 mL/min/{1.73_m2} — AB (ref >59)
GLUCOSE: 93 mg/dL (ref 65–99)
Globulin, Total: 3.3 g/dL (ref 1.5–4.5)
Potassium: 4.7 mmol/L (ref 3.5–5.2)
Sodium: 140 mmol/L (ref 134–144)
Total Protein: 7.3 g/dL (ref 6.0–8.5)

## 2015-05-08 ENCOUNTER — Telehealth: Payer: Self-pay | Admitting: Cardiovascular Disease

## 2015-05-08 NOTE — Telephone Encounter (Signed)
Patient wants most recent labs sent via mail .

## 2015-10-17 ENCOUNTER — Encounter: Payer: Self-pay | Admitting: Cardiovascular Disease

## 2015-10-17 ENCOUNTER — Ambulatory Visit (INDEPENDENT_AMBULATORY_CARE_PROVIDER_SITE_OTHER): Payer: Medicare Other | Admitting: Cardiovascular Disease

## 2015-10-17 VITALS — BP 144/102 | HR 79 | Ht 70.0 in | Wt 258.8 lb

## 2015-10-17 DIAGNOSIS — I10 Essential (primary) hypertension: Secondary | ICD-10-CM

## 2015-10-17 DIAGNOSIS — I2510 Atherosclerotic heart disease of native coronary artery without angina pectoris: Secondary | ICD-10-CM | POA: Diagnosis not present

## 2015-10-17 DIAGNOSIS — E785 Hyperlipidemia, unspecified: Secondary | ICD-10-CM

## 2015-10-17 DIAGNOSIS — R5383 Other fatigue: Secondary | ICD-10-CM

## 2015-10-17 DIAGNOSIS — F17200 Nicotine dependence, unspecified, uncomplicated: Secondary | ICD-10-CM

## 2015-10-17 NOTE — Progress Notes (Signed)
Cardiology Office Note  Date:  10/17/2015   ID:  ARMOND MUSSETT, DOB 09/07/1945, MRN [id number]  PCP:  No PCP Per Patient   Chief Complaint  Patient presents with  . Other    6 month follow up. Meds reviewed by the patient verbally. Pt. c/o fatigue.     HPI:  70 year old male seen September of 2011 for evaluation of chest pain, cath showing severe coronary artery disease including left main disease with normal ejection fraction, coronary artery bypass graft on 11/07/09 (left internal mammary artery to distal left anterior descending coronary artery, saphenous vein graft to first obtuse marginal branch of the left circumflex coronary artery, saphenous vein graft to distal right coronary artery) who presents to for follow-up of his coronary artery disease History of morbid obesity, Previous smoker, medication noncompliance Chronic renal sufficiency  In follow-up today he reports that he is been having worsening leg swelling Lab work from March 2017 showing creatinine up to 1.5 HCTZ was held at that time, since then has had worsening swelling  He reports having Myalgias/cramping on HCTZ, creatinine 1.5 in 04/2015 He does have High fluid intake Weight going up, 11 pounds Several years ago weight was 185 pounds, now 260 Has shortness of breath on exertion, more arthritic pain  He feels there is a problem with his morning medications, does not feel well when he takes them  Again does not want a cholesterol medication Died is poor No primary care physician  EKG on today's visit shows normal sinus rhythm with rate 79 bpm, no significant ST or T-wave changes  Other past medical history Back in 2012 He stopped his amlodipine as he felt this was causing insomnia. He was taking metoprolol once a day  His weight at that time climbed from 185 after surgery to 227. He initially felt it was from fluid and was wondering if he needed a diuretic pill. We did lab work at his baseline metabolic  panel and BNP came back essentially normal   PMH:   has a past medical history of Coronary artery disease; Hyperlipidemia; and Hypertension.  PSH:    Past Surgical History:  Procedure Laterality Date  . CORONARY ARTERY BYPASS GRAFT      Current Outpatient Prescriptions  Medication Sig Dispense Refill  . aspirin 81 MG tablet Take 81 mg by mouth daily.      . hydrochlorothiazide (HYDRODIURIL) 25 MG tablet Take 1 tablet (25 mg total) by mouth daily. (Patient taking differently: Take 25 mg by mouth daily as needed. ) 90 tablet 3  . lisinopril (PRINIVIL,ZESTRIL) 40 MG tablet Take 1 tablet (40 mg total) by mouth daily. 90 tablet 3  . metoprolol tartrate (LOPRESSOR) 25 MG tablet Take 25 mg by mouth 2 (two) times daily.     No current facility-administered medications for this visit.      Allergies:   Lipitor [atorvastatin calcium]   Social History:  The patient  reports that he has quit smoking. His smoking use included Cigarettes. He has a 45.00 pack-year smoking history. He has never used smokeless tobacco. He reports that he does not drink alcohol or use drugs.   Family History:   family history includes Heart attack in his mother.    Review of Systems: Review of Systems  Constitutional: Negative.        Weight gain  Respiratory: Positive for shortness of breath.   Cardiovascular: Positive for leg swelling.  Gastrointestinal: Negative.   Musculoskeletal: Positive for joint pain.  Neurological:  Negative.   Psychiatric/Behavioral: Negative.   All other systems reviewed and are negative.    PHYSICAL EXAM: VS:  BP (!) 144/102 (BP Location: Left Arm, Patient Position: Sitting, Cuff Size: Large)   Pulse 79   Ht 5\' 10"  (1.778 m)   Wt 258 lb 12 oz (117.4 kg)   BMI 37.13 kg/m  , BMI Body mass index is 37.13 kg/m. GEN: Well nourished, well developed, in no acute distress , morbidly obese HEENT: normal  Neck: no JVD, carotid bruits, or masses Cardiac: RRR; no murmurs, rubs, or  gallops, trace lower extremity bilateral edema  Respiratory:  clear to auscultation bilaterally, normal work of breathing GI: soft, nontender, nondistended, + BS MS: no deformity or atrophy  Skin: warm and dry, no rash Neuro:  Strength and sensation are intact Psych: euthymic mood, full affect    Recent Labs: 04/14/2015: ALT 23; BUN 30; Creatinine, Ser 1.53; Potassium 4.7; Sodium 140    Lipid Panel Lab Results  Component Value Date   CHOL 213 (H) 04/14/2010   HDL 36 (L) 04/14/2010   LDLCALC 127 (H) 04/14/2010   TRIG 252 (H) 04/14/2010      Wt Readings from Last 3 Encounters:  10/17/15 258 lb 12 oz (117.4 kg)  04/11/15 253 lb 4 oz (114.9 kg)  10/07/14 247 lb 4 oz (112.2 kg)       ASSESSMENT AND PLAN:  Atherosclerosis of native coronary artery of native heart without angina pectoris - Plan: EKG 12-Lead Currently with no symptoms of angina. No further workup at this time.  He does not want a statin  Essential hypertension, benign - Plan: EKG 12-Lead Encouraged him to take  Other fatigue Unable to exclude sleep apnea, sleeps 12 hours a day or more Feels tired Reports he sleeps fine  Morbid obesity due to excess calories ([place]) We have encouraged continued exercise, careful diet management in an effort to lose weight.  TOBACCO ABUSE We have encouraged him to continue to work on weaning his cigarettes and smoking cessation. He will continue to work on this and does not want any assistance with chantix.   Hyperlipidemia he does not want a cholesterol medication Encouraged weight loss  Leg edema Recommended he start HCTZ every other day Minimize his fluid intake Likely has component of diastolic CHF  Recommended that he needs primary care  Total encounter time more than 25 minutes  Greater than 50% was spent in counseling and coordination of care with the patient   Disposition:   F/U  6 months   Orders Placed This Encounter  Procedures  . EKG 12-Lead      Signed, [name], M.D., Ph.D. 10/17/2015  [place], [place]

## 2015-10-17 NOTE — Patient Instructions (Addendum)
Medication Instructions:   Try 1 to 2 HCTZ as needed for leg swelling and weight gain If you dont urinate well, Call the office  Watch the [place] intake  Labwork:  No new labs needed  Testing/Procedures:  No further testing at this time   Follow-Up: It was a pleasure seeing you in the office today. Please call us if you have new issues that need to be addressed before your next appt.  [phone]  Your physician wants you to follow-up in: 6 months.  You will receive a reminder letter in the mail two months in advance. If you don't receive a letter, please call our office to schedule the follow-up appointment.  If you need a refill on your cardiac medications before your next appointment, please call your pharmacy.

## 2015-10-20 ENCOUNTER — Telehealth: Payer: Self-pay | Admitting: Cardiovascular Disease

## 2015-10-20 NOTE — Telephone Encounter (Signed)
Pt calling stating we gave him a diateretic  He is stating it is giving him leg cramps He would like to try another kind of diuretic He can't handle this one  Please advise.

## 2015-10-20 NOTE — Telephone Encounter (Signed)
Pt states he figured out he "took too much" of HCTZ and does not need a call back.

## 2015-10-20 NOTE — Telephone Encounter (Signed)
Spoke w/ pt.  He reports that he was started on HCTZ 25 mg. Reports that it "brings my BP down pretty nice", but is causing severe leg cramps.  Reports that he previously took w/ same results, so he d/c'd HCTZ. He would like to know if there is an alternative diuretic that he can take that will not cause leg cramps. Please advise.  Thank you.

## 2015-11-14 ENCOUNTER — Other Ambulatory Visit: Payer: Self-pay | Admitting: Cardiovascular Disease

## 2016-06-26 NOTE — Progress Notes (Signed)
Cardiology Office Note  Date:  06/29/2016   ID:  YI HAUGAN, DOB 1946/02/08, MRN [id number]  PCP:  Patient, No Pcp Per   Chief Complaint  Patient presents with  . other    6 month follow up. Patient denies chest pain and SOB. Meds reviewed verbally with patient.     HPI:  71 year old male with PMH of  chest pain, 10/2009 cath showing severe coronary artery disease including left main disease, normal ejection fraction,  CABG on 11/07/09 (left internal mammary artery to distal left anterior descending coronary artery, saphenous vein graft to first obtuse marginal branch of the left circumflex coronary artery, saphenous vein graft to distal right coronary artery)  morbid obesity, Chronic diastolic CHF Previous smoker, medication noncompliance Chronic renal sufficiency who presents to for follow-up of his coronary artery disease  Stopped HCTZ, cramps better Then ankles swelled Now back on 1/2 dose daily Drinking grape juice  He feels there is a problem with his morning medications, does not feel well when he takes them  Again does not want a cholesterol medication Died is poor No exercise program No primary care physician Has shortness of breath on exertion, more arthritic pain  EKG on today's visit shows normal sinus rhythm with rate 73 bpm, no significant ST wave changes non specific T wave ABN  Other past medical history Lab work from March 2017 showing creatinine up to 1.5 HCTZ was held at that time, since then has had worsening swelling  He reports having Myalgias/cramping on HCTZ, creatinine 1.5 in 04/2015 High fluid intake Several years ago weight was 185 pounds, now 260  Back in 2012 He stopped his amlodipine as he felt this was causing insomnia.   His weight at that time climbed from 185 after surgery to 227.    PMH:   has a past medical history of Coronary artery disease; Hyperlipidemia; and Hypertension.  PSH:    Past Surgical History:  Procedure  Laterality Date  . CORONARY ARTERY BYPASS GRAFT      Current Outpatient Prescriptions  Medication Sig Dispense Refill  . aspirin 81 MG tablet Take 81 mg by mouth daily.      . hydrochlorothiazide (HYDRODIURIL) 25 MG tablet Take 1 tablet (25 mg total) by mouth daily. (Patient taking differently: Take 25 mg by mouth daily as needed. ) 90 tablet 3  . lisinopril (PRINIVIL,ZESTRIL) 20 MG tablet TAKE TWO TABLETS BY MOUTH ONCE DAILY 180 tablet 3  . metoprolol tartrate (LOPRESSOR) 25 MG tablet Take 25 mg by mouth 2 (two) times daily.     No current facility-administered medications for this visit.      Allergies:   Lipitor [atorvastatin calcium]   Social History:  The patient  reports that he has quit smoking. His smoking use included Cigarettes. He has a 45.00 pack-year smoking history. He has never used smokeless tobacco. He reports that he does not drink alcohol or use drugs.   Family History:   family history includes Heart attack in his mother.    Review of Systems: Review of Systems  Constitutional: Negative.        Weight gain  Respiratory: Positive for shortness of breath.   Cardiovascular: Positive for leg swelling.  Gastrointestinal: Negative.   Musculoskeletal: Positive for joint pain.  Neurological: Negative.   Psychiatric/Behavioral: Negative.   All other systems reviewed and are negative.    PHYSICAL EXAM: VS:  BP (!) 160/84 (BP Location: Left Arm, Patient Position: Sitting, Cuff Size: Normal)  Pulse 73   Ht 5\' 10"  (1.778 m)   Wt 262 lb 4 oz (119 kg)   BMI 37.63 kg/m  , BMI Body mass index is 37.63 kg/m. GEN: Well nourished, well developed, in no acute distress , morbidly obese HEENT: normal  Neck: no JVD, carotid bruits, or masses Cardiac: RRR; no murmurs, rubs, or gallops, trace lower extremity bilateral edema  Respiratory:  clear to auscultation bilaterally, normal work of breathing GI: soft, nontender, nondistended, + BS MS: no deformity or atrophy  Skin:  warm and dry, no rash Neuro:  Strength and sensation are intact Psych: euthymic mood, full affect    Recent Labs: No results found for requested labs within last 8760 hours.    Lipid Panel Lab Results  Component Value Date   CHOL 213 (H) 04/14/2010   HDL 36 (L) 04/14/2010   LDLCALC 127 (H) 04/14/2010   TRIG 252 (H) 04/14/2010      Wt Readings from Last 3 Encounters:  06/29/16 262 lb 4 oz (119 kg)  10/17/15 258 lb 12 oz (117.4 kg)  04/11/15 253 lb 4 oz (114.9 kg)       ASSESSMENT AND PLAN:  Atherosclerosis of native coronary artery of native heart without angina pectoris - Plan: EKG 12-Lead Currently with no symptoms of angina. No further workup at this time.  He does not want a statin  Essential hypertension, benign - Plan: EKG 12-Lead Blood pressure is well controlled on today's visit. No changes made to the medications.  Other fatigue Suspect sleep apnea  Morbid obesity due to excess calories ([place]) We have encouraged continued exercise, careful diet management in an effort to lose weight.  TOBACCO ABUSE Quit smoking  Hyperlipidemia he does not want a cholesterol medication Encouraged weight loss  Leg edema Stay on HCTZ every other day Minimize his fluid intake Likely has component of diastolic CHF  Recommended that he needs primary care  Total encounter time more than 25 minutes  Greater than 50% was spent in counseling and coordination of care with the patient   Disposition:   F/U  12 months   Orders Placed This Encounter  Procedures  . EKG 12-Lead     Signed, [name], M.D., Ph.D. 06/29/2016  [place], [place]

## 2016-06-29 ENCOUNTER — Encounter: Payer: Self-pay | Admitting: Cardiovascular Disease

## 2016-06-29 ENCOUNTER — Ambulatory Visit (INDEPENDENT_AMBULATORY_CARE_PROVIDER_SITE_OTHER): Payer: Medicare Other | Admitting: Cardiovascular Disease

## 2016-06-29 VITALS — BP 160/84 | HR 73 | Ht 70.0 in | Wt 262.2 lb

## 2016-06-29 DIAGNOSIS — E782 Mixed hyperlipidemia: Secondary | ICD-10-CM | POA: Diagnosis not present

## 2016-06-29 DIAGNOSIS — F17200 Nicotine dependence, unspecified, uncomplicated: Secondary | ICD-10-CM

## 2016-06-29 DIAGNOSIS — Z951 Presence of aortocoronary bypass graft: Secondary | ICD-10-CM | POA: Diagnosis not present

## 2016-06-29 DIAGNOSIS — I10 Essential (primary) hypertension: Secondary | ICD-10-CM

## 2016-06-29 MED ORDER — METOPROLOL TARTRATE 25 MG PO TABS: 25.0000 mg | ORAL_TABLET | Freq: Two times a day (BID) | ORAL | 3 refills | Status: DC

## 2016-06-29 MED ORDER — LISINOPRIL 20 MG PO TABS: 40.0000 mg | ORAL_TABLET | Freq: Every day | ORAL | 3 refills | Status: DC

## 2016-06-29 MED ORDER — HYDROCHLOROTHIAZIDE 25 MG PO TABS: 25.0000 mg | ORAL_TABLET | Freq: Every day | ORAL | 3 refills | Status: DC

## 2016-06-29 NOTE — Patient Instructions (Signed)

## 2017-06-30 ENCOUNTER — Other Ambulatory Visit: Payer: Self-pay | Admitting: Cardiovascular Disease

## 2017-08-24 ENCOUNTER — Other Ambulatory Visit: Payer: Self-pay | Admitting: Cardiovascular Disease

## 2017-09-04 NOTE — Progress Notes (Signed)
Cardiology Office Note  Date:  09/06/2017   ID:  Logan [name], DOB 07/01/1945, MRN [id number]  PCP:  Patient, No Pcp Per   Chief Complaint  Patient presents with  . other    12 month follow up. Meds reviewed by the pt. verbally. Pt. c/o LE edema and pain in hips when walking.     HPI:  72 year old male with PMH of chest pain, 10/2009 cath showing severe coronary artery disease including left main disease, normal ejection fraction,  CABG on 11/07/09 (left internal mammary artery to distal left anterior descending coronary artery, saphenous vein graft to first obtuse marginal branch of the left circumflex coronary artery, saphenous vein graft to distal right coronary artery)  morbid obesity, Chronic diastolic CHF Previous smoker, medication noncompliance Chronic renal sufficiency who presents for follow-up of his coronary artery disease  More leg swelling, Eating lots, including watermelon. Lots of sweet tea and great juice Weight up 15 pounds in 2 years Taking HCTZ 1/2 pill, cramping better Poor diet  no regular exercise  Stopped asa on his own Having anxiety/dreams, blames the meds Cut all his medications in half Sedentary, poor conditioning Blood pressure running high today  does not want a cholesterol medication as on previous visits No primary care physician Has shortness of breath on exertion, more arthritic pain in his lower back, wants to blame heart or the pills  EKG on today's visit shows normal sinus rhythm with rate 78 bpm, no significant ST wave changes  Other past medical history Lab work from March 2017 showing creatinine up to 1.5 HCTZ was held at that time, since then has had worsening swelling  He reports having Myalgias/cramping on HCTZ, creatinine 1.5 in 04/2015 High fluid intake Several years ago weight was 185 pounds, now 260  Back in 2012 He stopped his amlodipine as he felt this was causing insomnia.   His weight at that time climbed from 185  after surgery to 227.    PMH:   has a past medical history of Coronary artery disease, Hyperlipidemia, and Hypertension.  PSH:    Past Surgical History:  Procedure Laterality Date  . CORONARY ARTERY BYPASS GRAFT      Current Outpatient Medications  Medication Sig Dispense Refill  . aspirin 81 MG tablet Take 81 mg by mouth daily.      . hydrochlorothiazide (HYDRODIURIL) 25 MG tablet TAKE 1 TABLET BY MOUTH ONCE DAILY 30 tablet 0  . lisinopril (PRINIVIL,ZESTRIL) 20 MG tablet TAKE 2 TABLETS BY MOUTH ONCE DAILY 180 tablet 3  . metoprolol tartrate (LOPRESSOR) 25 MG tablet Take 1 tablet (25 mg total) by mouth 2 (two) times daily. 180 tablet 3   No current facility-administered medications for this visit.      Allergies:   Lipitor [atorvastatin calcium]   Social History:  The patient  reports that he has quit smoking. His smoking use included cigarettes. He has a 45.00 pack-year smoking history. He has never used smokeless tobacco. He reports that he does not drink alcohol or use drugs.   Family History:   family history includes Heart attack in his mother.    Review of Systems: Review of Systems  Constitutional: Negative.        Weight gain  Respiratory: Positive for shortness of breath.   Cardiovascular: Positive for leg swelling.  Gastrointestinal: Negative.   Musculoskeletal: Positive for joint pain.  Neurological: Negative.   Psychiatric/Behavioral: Negative.   All other systems reviewed and are negative.  PHYSICAL EXAM: VS:  BP (!) 148/100 (BP Location: Left Arm, Patient Position: Sitting, Cuff Size: Large)   Pulse 78   Ht 5\' 10"  (1.778 m)   Wt 273 lb (123.8 kg)   BMI 39.17 kg/m  , BMI Body mass index is 39.17 kg/m. Constitutional:  oriented to person, place, and time. No distress.  HENT:  Head: Normocephalic and atraumatic.  Eyes:  no discharge. No scleral icterus.  Neck: Normal range of motion. Neck supple. No JVD present.  Cardiovascular: Normal rate,  regular rhythm, normal heart sounds and intact distal pulses. Exam reveals no gallop and no friction rub. No edema No murmur heard. Pulmonary/Chest: Effort normal and breath sounds normal. No stridor. No respiratory distress.  no wheezes.  no rales.  no tenderness.  Abdominal: Soft.  no distension.  no tenderness.  Musculoskeletal: Normal range of motion.  no  tenderness or deformity.  Neurological:  normal muscle tone. Coordination normal. No atrophy Skin: Skin is warm and dry. No rash noted. not diaphoretic.  Psychiatric:  normal mood and affect. behavior is normal. Thought content normal.    Recent Labs: No results found for requested labs within last 8760 hours.    Lipid Panel Lab Results  Component Value Date   CHOL 213 (H) 04/14/2010   HDL 36 (L) 04/14/2010   LDLCALC 127 (H) 04/14/2010   TRIG 252 (H) 04/14/2010      Wt Readings from Last 3 Encounters:  09/06/17 273 lb (123.8 kg)  06/29/16 262 lb 4 oz (119 kg)  10/17/15 258 lb 12 oz (117.4 kg)       ASSESSMENT AND PLAN:  Atherosclerosis of native coronary artery of native heart without angina pectoris - Plan: EKG 12-Lead Currently with no symptoms of angina. He does not want a statin He stopped aspirin on his own we have recommended he restart No particular reason why he stopped aspirin other than he read something in the newspaper Stressed importance of aggressive weight loss, blood pressure control  Essential hypertension, benign - Plan: EKG 12-Lead He is picking and choosing which medications he would like to take Blames many of his insomnia anxiety and other arthritides on the medications Recommended he restart his medications as prescribed as blood pressure is markedly elevated  Other fatigue Suspect sleep apnea He is blaming the medications on anxiety and sleep disorder  Morbid obesity due to excess calories ([place]) Blaming the medications for his weight gain over the past 2 years Diet is poor Dietary  guide provided  TOBACCO ABUSE Quit smoking  Hyperlipidemia he does not want a cholesterol medication Encouraged weight loss long discussion concerning need for 80 pound weight loss and dietary changes  Leg edema  diastolic CHF Prescribed him Lasix when necessary for leg swelling and shortness of breath Stay on HCTZ but he has been taking low doses sometimes missing doses WE have ordered TSH and BMP  Recommended that he needs primary care Information on signing up with primary care provided to him  Total encounter time more than 45 minutes  Greater than 50% was spent in counseling and coordination of care with the patient   Disposition:   F/U  12 months   Orders Placed This Encounter  Procedures  . EKG 12-Lead     Signed, [name], M.D., Ph.D. 09/06/2017  [place], [place]

## 2017-09-06 ENCOUNTER — Ambulatory Visit: Payer: Medicare Other | Admitting: Cardiovascular Disease

## 2017-09-06 ENCOUNTER — Encounter: Payer: Self-pay | Admitting: Cardiovascular Disease

## 2017-09-06 VITALS — BP 148/100 | HR 78 | Ht 70.0 in | Wt 273.0 lb

## 2017-09-06 DIAGNOSIS — I10 Essential (primary) hypertension: Secondary | ICD-10-CM

## 2017-09-06 DIAGNOSIS — F17200 Nicotine dependence, unspecified, uncomplicated: Secondary | ICD-10-CM

## 2017-09-06 DIAGNOSIS — Z951 Presence of aortocoronary bypass graft: Secondary | ICD-10-CM

## 2017-09-06 DIAGNOSIS — I25118 Atherosclerotic heart disease of native coronary artery with other forms of angina pectoris: Secondary | ICD-10-CM | POA: Diagnosis not present

## 2017-09-06 DIAGNOSIS — E782 Mixed hyperlipidemia: Secondary | ICD-10-CM

## 2017-09-06 MED ORDER — METOPROLOL TARTRATE 25 MG PO TABS: 25.0000 mg | ORAL_TABLET | Freq: Two times a day (BID) | ORAL | 3 refills | Status: DC

## 2017-09-06 MED ORDER — LISINOPRIL 20 MG PO TABS: 40.0000 mg | ORAL_TABLET | Freq: Every day | ORAL | 3 refills | Status: DC

## 2017-09-06 MED ORDER — FUROSEMIDE 20 MG PO TABS: 20.0000 mg | ORAL_TABLET | Freq: Every day | ORAL | 1 refills | Status: DC | PRN

## 2017-09-06 MED ORDER — HYDROCHLOROTHIAZIDE 25 MG PO TABS: 25.0000 mg | ORAL_TABLET | Freq: Every day | ORAL | 3 refills | Status: DC

## 2017-09-06 NOTE — Patient Instructions (Addendum)
Medication Instructions:   Please take lasix as needed for leg swelling  Labwork:  BMP, TSH today  Testing/Procedures:  No further testing at this time   Follow-Up: It was a pleasure seeing you in the office today. Please call us if you have new issues that need to be addressed before your next appt.  [phone]  Your physician wants you to follow-up in: 12 months.  You will receive a reminder letter in the mail two months in advance. If you don't receive a letter, please call our office to schedule the follow-up appointment.  If you need a refill on your cardiac medications before your next appointment, please call your pharmacy.  For educational health videos Log in to : [email or web address] Or : [email or web address], password : [email or web address]

## 2017-09-07 LAB — BASIC METABOLIC PANEL
BUN / CREAT RATIO: 14 (ref 10–24)
BUN: 22 mg/dL (ref 8–27)
CHLORIDE: 96 mmol/L (ref 96–106)
CO2: 26 mmol/L (ref 20–29)
Calcium: 9.4 mg/dL (ref 8.6–10.2)
Creatinine, Ser: 1.57 mg/dL — ABNORMAL HIGH (ref 0.76–1.27)
GFR calc non Af Amer: 43 mL/min/{1.73_m2} — ABNORMAL LOW (ref >59)
GFR, EST AFRICAN AMERICAN: 50 mL/min/{1.73_m2} — AB (ref >59)
GLUCOSE: 93 mg/dL (ref 65–99)
POTASSIUM: 4.8 mmol/L (ref 3.5–5.2)
SODIUM: 139 mmol/L (ref 134–144)

## 2017-09-07 LAB — TSH: TSH: 8.95 u[IU]/mL — AB (ref 0.450–4.500)

## 2017-09-09 ENCOUNTER — Ambulatory Visit: Payer: Medicare Other | Admitting: Internal Medicine

## 2017-09-09 ENCOUNTER — Encounter: Payer: Self-pay | Admitting: Internal Medicine

## 2017-09-09 ENCOUNTER — Ambulatory Visit (INDEPENDENT_AMBULATORY_CARE_PROVIDER_SITE_OTHER): Payer: Medicare Other

## 2017-09-09 VITALS — BP 168/88 | HR 77 | Temp 98.7°F | Resp 16 | Ht 70.0 in | Wt 271.0 lb

## 2017-09-09 DIAGNOSIS — N400 Enlarged prostate without lower urinary tract symptoms: Secondary | ICD-10-CM

## 2017-09-09 DIAGNOSIS — R319 Hematuria, unspecified: Secondary | ICD-10-CM

## 2017-09-09 DIAGNOSIS — R7989 Other specified abnormal findings of blood chemistry: Secondary | ICD-10-CM | POA: Diagnosis not present

## 2017-09-09 DIAGNOSIS — G8929 Other chronic pain: Secondary | ICD-10-CM | POA: Diagnosis not present

## 2017-09-09 DIAGNOSIS — R5383 Other fatigue: Secondary | ICD-10-CM

## 2017-09-09 DIAGNOSIS — Z13818 Encounter for screening for other digestive system disorders: Secondary | ICD-10-CM

## 2017-09-09 DIAGNOSIS — M545 Low back pain, unspecified: Secondary | ICD-10-CM | POA: Insufficient documentation

## 2017-09-09 DIAGNOSIS — R600 Localized edema: Secondary | ICD-10-CM

## 2017-09-09 DIAGNOSIS — Z125 Encounter for screening for malignant neoplasm of prostate: Secondary | ICD-10-CM | POA: Diagnosis not present

## 2017-09-09 DIAGNOSIS — F17200 Nicotine dependence, unspecified, uncomplicated: Secondary | ICD-10-CM

## 2017-09-09 DIAGNOSIS — Z1159 Encounter for screening for other viral diseases: Secondary | ICD-10-CM

## 2017-09-09 DIAGNOSIS — E782 Mixed hyperlipidemia: Secondary | ICD-10-CM

## 2017-09-09 DIAGNOSIS — Z13220 Encounter for screening for lipoid disorders: Secondary | ICD-10-CM

## 2017-09-09 DIAGNOSIS — I2510 Atherosclerotic heart disease of native coronary artery without angina pectoris: Secondary | ICD-10-CM

## 2017-09-09 DIAGNOSIS — I10 Essential (primary) hypertension: Secondary | ICD-10-CM

## 2017-09-09 DIAGNOSIS — R739 Hyperglycemia, unspecified: Secondary | ICD-10-CM | POA: Diagnosis not present

## 2017-09-09 DIAGNOSIS — E559 Vitamin D deficiency, unspecified: Secondary | ICD-10-CM

## 2017-09-09 HISTORY — DX: Other specified abnormal findings of blood chemistry: R79.89

## 2017-09-09 HISTORY — DX: Localized edema: R60.0

## 2017-09-09 HISTORY — DX: Low back pain, unspecified: M54.50

## 2017-09-09 LAB — HEMOGLOBIN A1C: Hgb A1c MFr Bld: 6 % (ref 4.6–6.5)

## 2017-09-09 LAB — T4, FREE: FREE T4: 0.78 ng/dL (ref 0.60–1.60)

## 2017-09-09 LAB — HEPATIC FUNCTION PANEL
ALBUMIN: 4.3 g/dL (ref 3.5–5.2)
ALK PHOS: 101 U/L (ref 39–117)
ALT: 17 U/L (ref 0–53)
AST: 15 U/L (ref 0–37)
Bilirubin, Direct: 0.1 mg/dL (ref 0.0–0.3)
TOTAL PROTEIN: 7.7 g/dL (ref 6.0–8.3)
Total Bilirubin: 0.7 mg/dL (ref 0.2–1.2)

## 2017-09-09 LAB — CBC WITH DIFFERENTIAL/PLATELET
BASOS ABS: 0.2 10*3/uL — AB (ref 0.0–0.1)
Basophils Relative: 2.1 % (ref 0.0–3.0)
EOS ABS: 0.2 10*3/uL (ref 0.0–0.7)
Eosinophils Relative: 2.3 % (ref 0.0–5.0)
HCT: 40.5 % (ref 39.0–52.0)
Hemoglobin: 13.6 g/dL (ref 13.0–17.0)
LYMPHS ABS: 1.6 10*3/uL (ref 0.7–4.0)
Lymphocytes Relative: 18.3 % (ref 12.0–46.0)
MCHC: 33.5 g/dL (ref 30.0–36.0)
MCV: 91.1 fl (ref 78.0–100.0)
MONO ABS: 0.6 10*3/uL (ref 0.1–1.0)
MONOS PCT: 7.1 % (ref 3.0–12.0)
NEUTROS PCT: 70.2 % (ref 43.0–77.0)
Neutro Abs: 6.1 10*3/uL (ref 1.4–7.7)
Platelets: 278 10*3/uL (ref 150.0–400.0)
RBC: 4.45 Mil/uL (ref 4.22–5.81)
RDW: 15.9 % — ABNORMAL HIGH (ref 11.5–15.5)
WBC: 8.7 10*3/uL (ref 4.0–10.5)

## 2017-09-09 LAB — T3, FREE: T3, Free: 4.1 pg/mL (ref 2.3–4.2)

## 2017-09-09 LAB — PSA, MEDICARE: PSA: 9.42 ng/ml — ABNORMAL HIGH (ref 0.10–4.00)

## 2017-09-09 MED ORDER — FUROSEMIDE 40 MG PO TABS: 40.0000 mg | ORAL_TABLET | Freq: Every day | ORAL | 2 refills | Status: DC | PRN

## 2017-09-09 NOTE — Progress Notes (Signed)
Chief Complaint  Patient presents with  . Establish Care    New Patient   New patient  1. CAD s/p CABG x 3 f/u Dr. Rockey Situ and labs recently with elevated TSH and CKD 3 stable x 2-3 years pt needs PCP  2. HTN uncontrolled per pt at home lower 130s/70s he reports "white coat htn" did take medications today lasix 20, lis 20, lopressor 25 bid. He still reports leg edema on lasix. He does report he snores unsure if stops breathing  3. C/o low back pain x years w/o radiation down legs nothing tried  4. C/o enlarged prostate x 12 years   Review of Systems  Constitutional: Positive for malaise/fatigue. Negative for weight loss.       Wt gain  HENT: Negative for hearing loss.   Eyes: Negative for blurred vision.  Respiratory: Negative for shortness of breath.   Cardiovascular: Negative for chest pain.  Gastrointestinal: Negative for constipation.  Genitourinary: Negative for dysuria.  Musculoskeletal: Positive for back pain.  Skin:       Dry skin and hair   Neurological: Negative for headaches.  Psychiatric/Behavioral: Negative for depression. The patient does not have insomnia.    Past Medical History:  Diagnosis Date  . Chronic kidney disease   . Coronary artery disease   . Hyperlipidemia   . Hypertension    Past Surgical History:  Procedure Laterality Date  . CORONARY ARTERY BYPASS GRAFT    . triple bypass  2011   Family History  Problem Relation Age of Onset  . Heart attack Mother   . Heart disease Mother   . Heart disease Father   . Diabetes Sister   . Kidney disease Sister   . Stroke Maternal Grandmother   . Heart attack Maternal Grandfather   . Stroke Maternal Grandfather   . Heart attack Paternal Grandmother   . Stroke Paternal Grandfather    Social History   Socioeconomic History  . Marital status: Married    Spouse name: Not on file  . Number of children: Not on file  . Years of education: Not on file  . Highest education level: Not on file  Occupational  History  . Not on file  Social Needs  . Financial resource strain: Not on file  . Food insecurity:    Worry: Not on file    Inability: Not on file  . Transportation needs:    Medical: Not on file    Non-medical: Not on file  Tobacco Use  . Smoking status: Former Smoker    Packs/day: 1.50    Years: 30.00    Pack years: 45.00    Types: Cigarettes  . Smokeless tobacco: Never Used  Substance and Sexual Activity  . Alcohol use: No  . Drug use: No  . Sexual activity: Not Currently  Lifestyle  . Physical activity:    Days per week: Not on file    Minutes per session: Not on file  . Stress: Not on file  Relationships  . Social connections:    Talks on phone: Not on file    Gets together: Not on file    Attends religious service: Not on file    Active member of club or organization: Not on file    Attends meetings of clubs or organizations: Not on file    Relationship status: Not on file  . Intimate partner violence:    Fear of current or ex partner: Not on file    Emotionally abused:  Not on file    Physically abused: Not on file    Forced sexual activity: Not on file  Other Topics Concern  . Not on file  Social History Narrative  . Not on file   Current Meds  Medication Sig  . aspirin 81 MG tablet Take 81 mg by mouth daily.    . furosemide (LASIX) 20 MG tablet Take 1 tablet (20 mg total) by mouth daily as needed.  [place] Kitchen lisinopril (PRINIVIL,ZESTRIL) 20 MG tablet Take 2 tablets (40 mg total) by mouth daily.  . metoprolol tartrate (LOPRESSOR) 25 MG tablet Take 1 tablet (25 mg total) by mouth 2 (two) times daily.   Allergies  Allergen Reactions  . Lipitor [Atorvastatin Calcium]     Muscle aches   Recent Results (from the past 2160 hour(s))  Basic metabolic panel     Status: Abnormal   Collection Time: 09/06/17  9:13 AM  Result Value Ref Range   Glucose 93 65 - 99 mg/dL   BUN 22 8 - 27 mg/dL   Creatinine, Ser 1.57 (H) 0.76 - 1.27 mg/dL   GFR calc non Af Amer 43 (L) >59  mL/min/1.73   GFR calc Af Amer 50 (L) >59 mL/min/1.73   BUN/Creatinine Ratio 14 10 - 24   Sodium 139 134 - 144 mmol/L   Potassium 4.8 3.5 - 5.2 mmol/L   Chloride 96 96 - 106 mmol/L   CO2 26 20 - 29 mmol/L   Calcium 9.4 8.6 - 10.2 mg/dL  TSH     Status: Abnormal   Collection Time: 09/06/17  9:13 AM  Result Value Ref Range   TSH 8.950 (H) 0.450 - 4.500 uIU/mL   Objective  Body mass index is 38.88 kg/m. Wt Readings from Last 3 Encounters:  09/09/17 271 lb (122.9 kg)  09/06/17 273 lb (123.8 kg)  06/29/16 262 lb 4 oz (119 kg)   Temp Readings from Last 3 Encounters:  09/09/17 98.7 F (37.1 C) (Oral)   BP Readings from Last 3 Encounters:  09/09/17 (!) 168/88  09/06/17 (!) 148/100  06/29/16 (!) 160/84   Pulse Readings from Last 3 Encounters:  09/09/17 77  09/06/17 78  06/29/16 73    Physical Exam  Constitutional: He is oriented to person, place, and time. He appears well-developed and well-nourished. He is cooperative.  HENT:  Head: Normocephalic.  Nose: Nose normal.  Mouth/Throat: Oropharynx is clear and moist and mucous membranes are normal.  Eyes: Pupils are equal, round, and reactive to light. Conjunctivae are normal.  Cardiovascular: Normal rate, regular rhythm and normal heart sounds.  Pulmonary/Chest: Effort normal and breath sounds normal.  Neurological: He is alert and oriented to person, place, and time. Gait normal.  Skin: Skin is warm, dry and intact.  Psychiatric: He has a normal mood and affect. His speech is normal and behavior is normal. Judgment and thought content normal. Cognition and memory are normal.  Nursing note and vitals reviewed. trace leg edema b/l   Assessment   1. CAD s/p CABG x 3 vessel 2. HTN/leg edema 3. Low back pain  4. Enlarged prostate  5. Elevated tsh  6. HM  Plan   1. F/u cards  Declines statin  Need to check lipid in future  Do not see echo in system consider future 2.  Check BP at f/u consider adjusting meds  Increase  lasix to 40 mg qd prn  Consider sleep study in future  3. Xray today  4. Labs denies sig sx's  5. More thyroid labs today  6.  Will get flu shot  rec get Tdap  Will disc shingrix in future, checking mmr and hep B   Check labs today not fasting  Will need DRE in future  Denies colonoscopy will consider cologuard in future pt to think about disc today  CT chest consider former smoker [phone] 1-2 ppd no FH lung cancer reviewed today pt to consider   Provider: Dr. Olivia Mackie McLean-Scocuzza-Internal Medicine

## 2017-09-09 NOTE — Patient Instructions (Addendum)
Consider flu high dose shot and Tdap vaccine at the pharmacy  F/u in 1 month    Back Pain, Adult Many adults have back pain from time to time. Common causes of back pain include:  A strained muscle or ligament.  Wear and tear (degeneration) of the spinal disks.  Arthritis.  A hit to the back.  Back pain can be short-lived (acute) or last a long time (chronic). A physical exam, lab tests, and imaging studies may be done to find the cause of your pain. Follow these instructions at home: Managing pain and stiffness  Take over-the-counter and prescription medicines only as told by your health care provider.  If directed, apply heat to the affected area as often as told by your health care provider. Use the heat source that your health care provider recommends, such as a moist heat pack or a heating pad. ? Place a towel between your skin and the heat source. ? Leave the heat on for 20-30 minutes. ? Remove the heat if your skin turns bright red. This is especially important if you are unable to feel pain, heat, or cold. You have a greater risk of getting burned.  If directed, apply ice to the injured area: ? Put ice in a plastic bag. ? Place a towel between your skin and the bag. ? Leave the ice on for 20 minutes, 2-3 times a day for the first 2-3 days. Activity  Do not stay in bed. Resting more than 1-2 days can delay your recovery.  Take short walks on even surfaces as soon as you are able. Try to increase the length of time you walk each day.  Do not sit, drive, or stand in one place for more than 30 minutes at a time. Sitting or standing for long periods of time can put stress on your back.  Use proper lifting techniques. When you bend and lift, use positions that put less stress on your back: ? [place] your knees. ? Keep the load close to your body. ? Avoid twisting.  Exercise regularly as told by your health care provider. Exercising will help your back heal faster. This also  helps prevent back injuries by keeping muscles strong and flexible.  Your health care provider may recommend that you see a physical therapist. This person can help you come up with a safe exercise program. Do any exercises as told by your physical therapist. Lifestyle  Maintain a healthy weight. Extra weight puts stress on your back and makes it difficult to have good posture.  Avoid activities or situations that make you feel anxious or stressed. Learn ways to manage anxiety and stress. One way to manage stress is through exercise. Stress and anxiety increase muscle tension and can make back pain worse. General instructions  Sleep on a firm mattress in a comfortable position. Try lying on your side with your knees slightly bent. If you lie on your back, put a pillow under your knees.  Follow your treatment plan as told by your health care provider. This may include: ? Cognitive or behavioral therapy. ? Acupuncture or massage therapy. ? Meditation or yoga. Contact a health care provider if:  You have pain that is not relieved with rest or medicine.  You have increasing pain going down into your legs or buttocks.  Your pain does not improve in 2 weeks.  You have pain at night.  You lose weight.  You have a fever or chills. Get help right away if:  You develop new bowel or bladder control problems.  You have unusual weakness or numbness in your arms or legs.  You develop nausea or vomiting.  You develop abdominal pain.  You feel faint. Summary  Many adults have back pain from time to time. A physical exam, lab tests, and imaging studies may be done to find the cause of your pain.  Use proper lifting techniques. When you bend and lift, use positions that put less stress on your back.  Take over-the-counter and prescription medicines and apply heat or ice as directed by your health care provider. This information is not intended to replace advice given to you by your  health care provider. Make sure you discuss any questions you have with your health care provider. Document Released: 01/25/2005 Document Revised: 03/01/2016 Document Reviewed: 03/01/2016 Elsevier Interactive Patient Education  2018 [place] Vaccine (Diphtheria, Tetanus, and Pertussis): What You Need to Know 1. Why get vaccinated? Diphtheria, tetanus, and pertussis are serious diseases caused by bacteria. Diphtheria and pertussis are spread from person to person. Tetanus enters the body through cuts or wounds. DIPHTHERIA causes a thick covering in the back of the throat.  It can lead to breathing problems, paralysis, heart failure, and even death.  TETANUS (Lockjaw) causes painful tightening of the muscles, usually all over the body.  It can lead to "locking" of the jaw so the victim cannot open his mouth or swallow. Tetanus leads to death in up to 2 out of 10 cases.  PERTUSSIS (Whooping Cough) causes coughing spells so bad that it is hard for infants to eat, drink, or breathe. These spells can last for weeks.  It can lead to pneumonia, seizures (jerking and staring spells), brain damage, and death.  Diphtheria, tetanus, and pertussis vaccine (DTaP) can help prevent these diseases. Most children who are vaccinated with DTaP will be protected throughout childhood. Many more children would get these diseases if we stopped vaccinating. DTaP is a safer version of an older vaccine called DTP. DTP is no longer used in the [place]. 2. Who should get DTaP vaccine and when? Children should get 5 doses of DTaP vaccine, one dose at each of the following ages:  2 months  4 months  6 months  15-18 months  4-6 years  DTaP may be given at the same time as other vaccines. 3. Some children should not get DTaP vaccine or should wait  Children with minor illnesses, such as a cold, may be vaccinated. But children who are moderately or severely ill should usually wait until  they recover before getting DTaP vaccine.  Any child who had a life-threatening allergic reaction after a dose of DTaP should not get another dose.  Any child who suffered a brain or nervous system disease within 7 days after a dose of DTaP should not get another dose.  Talk with your doctor if your child: ? had a seizure or collapsed after a dose of DTaP, ? cried non-stop for 3 hours or more after a dose of DTaP, ? had a fever over 105F after a dose of DTaP. Ask your doctor for more information. Some of these children should not get another dose of pertussis vaccine, but may get a vaccine without pertussis, called DT. 4. Older children and adults DTaP is not licensed for adolescents, adults, or children 70 years of age and older. But older people still need protection. A vaccine called Tdap is similar to DTaP. A single dose of  Tdap is recommended for people 11 through 73 years of age. Another vaccine, called Td, protects against tetanus and diphtheria, but not pertussis. It is recommended every 10 years. There are separate Vaccine Information Statements for these vaccines. 5. What are the risks from DTaP vaccine? Getting diphtheria, tetanus, or pertussis disease is much riskier than getting DTaP vaccine. However, a vaccine, like any medicine, is capable of causing serious problems, such as severe allergic reactions. The risk of DTaP vaccine causing serious harm, or death, is extremely small. Mild problems (common)  Fever (up to about 1 child in 4)  Redness or swelling where the shot was given (up to about 1 child in 4)  Soreness or tenderness where the shot was given (up to about 1 child in 4) These problems occur more often after the 4th and 5th doses of the DTaP series than after earlier doses. Sometimes the 4th or 5th dose of DTaP vaccine is followed by swelling of the entire arm or leg in which the shot was given, lasting 1-7 days (up to about 1 child in 52). Other mild problems  include:  Fussiness (up to about 1 child in 3)  Tiredness or poor appetite (up to about 1 child in 10)  Vomiting (up to about 1 child in 65) These problems generally occur 1-3 days after the shot. Moderate problems (uncommon)  Seizure (jerking or staring) (about 1 child out of 14,000)  Non-stop crying, for 3 hours or more (up to about 1 child out of 1,000)  High fever, over 105F (about 1 child out of 16,000) Severe problems (very rare)  Serious allergic reaction (less than 1 out of a million doses)  Several other severe problems have been reported after DTaP vaccine. These include: ? Long-term seizures, coma, or lowered consciousness ? Permanent brain damage. These are so rare it is hard to tell if they are caused by the vaccine. Controlling fever is especially important for children who have had seizures, for any reason. It is also important if another family member has had seizures. You can reduce fever and pain by giving your child an aspirin-free pain reliever when the shot is given, and for the next 24 hours, following the package instructions. 6. What if there is a serious reaction? What should I look for? Look for anything that concerns you, such as signs of a severe allergic reaction, very high fever, or behavior changes. Signs of a severe allergic reaction can include hives, swelling of the face and throat, difficulty breathing, a fast heartbeat, dizziness, and weakness. These would start a few minutes to a few hours after the vaccination. What should I do?  If you think it is a severe allergic reaction or other emergency that can't wait, call 9-1-1 or get the person to the nearest hospital. Otherwise, call your doctor.  Afterward, the reaction should be reported to the Vaccine Adverse Event Reporting System (VAERS). Your doctor might file this report, or you can do it yourself through the VAERS web site at www.vaers.SamedayNews.es, or by calling 602-028-3609. ? VAERS is only  for reporting reactions. They do not give medical advice. 7. The National Vaccine Injury Compensation Program The [employer] Vaccine Injury Compensation Program (VICP) is a federal program that was created to compensate people who may have been injured by certain vaccines. Persons who believe they may have been injured by a vaccine can learn about the program and about filing a claim by calling (954) 779-0516 or visiting the [employer] website at GoldCloset.com.ee.  8. How can I learn more?  Ask your doctor.  Call your local or state health department.  Contact the Centers for Disease Control and Prevention (CDC): ? Call (940)394-6319 (1-800-CDC-INFO) or ? Visit CDC's website at http://hunter.com/ CDC DTaP Vaccine (Diphtheria, Tetanus, and Pertussis) VIS (06/24/05) This information is not intended to replace advice given to you by your health care provider. Make sure you discuss any questions you have with your health care provider. Document Released: 11/22/2005 Document Revised: 10/16/2015 Document Reviewed: 10/16/2015 Elsevier Interactive Patient Education  2017 [employer]    Chronic Kidney Disease, Adult Chronic kidney disease (CKD) happens when the kidneys are damaged during a time of 3 or more months. The kidneys are two organs that do many important jobs in the body. These jobs include:  Removing wastes and extra fluids from the blood.  Making hormones that maintain the amount of fluid in your tissues and blood vessels.  Making sure that the body has the right amount of fluids and chemicals.  Most of the time, this condition does not go away, but it can usually be controlled. Steps must be taken to slow down the kidney damage or stop it from getting worse. Otherwise, the kidneys may stop working. Follow these instructions at home:  Follow your diet as told by your doctor. You may need to avoid alcohol, salty foods (sodium), and foods that are high in potassium,  calcium, and protein.  Take over-the-counter and prescription medicines only as told by your doctor. Do not take any new medicines unless your doctor says you can do that. These include vitamins and minerals. ? Medicines and nutritional supplements can make kidney damage worse. ? Your doctor may need to change how much medicine you take.  Do not use any tobacco products. These include cigarettes, chewing tobacco, and e-cigarettes. If you need help quitting, ask your doctor.  Keep all follow-up visits as told by your doctor. This is important.  Check your blood pressure. Tell your doctor if there are changes to your blood pressure.  Get to a healthy weight. Stay at that weight. If you need help with this, ask your doctor.  Start or continue an exercise plan. Try to exercise at least 30 minutes a day, 5 days a week.  Stay up-to-date with your shots (immunizations) as told by your doctor. Contact a doctor if:  Your symptoms get worse.  You have new symptoms. Get help right away if:  You have symptoms of end-stage kidney disease. These include: ? Headaches. ? Skin that is darker or lighter than normal. ? Numbness in your hands or feet. ? Easy bruising. ? Having hiccups often. ? Chest pain. ? Shortness of breath. ? Stopping of menstrual periods in women.  You have a fever.  You are making very little pee (urine).  You have pain or bleeding when you pee (urinate). This information is not intended to replace advice given to you by your health care provider. Make sure you discuss any questions you have with your health care provider. Document Released: 04/21/2009 Document Revised: 07/03/2015 Document Reviewed: 09/24/2011 Elsevier Interactive Patient Education  2017 [employer].

## 2017-09-10 LAB — URINALYSIS, ROUTINE W REFLEX MICROSCOPIC
Bilirubin, UA: NEGATIVE
Glucose, UA: NEGATIVE
Ketones, UA: NEGATIVE
LEUKOCYTES UA: NEGATIVE
Nitrite, UA: NEGATIVE
Protein, UA: NEGATIVE
RBC UA: NEGATIVE
Specific Gravity, UA: 1.012 (ref 1.005–1.030)
Urobilinogen, Ur: 0.2 mg/dL (ref 0.2–1.0)
pH, UA: 5 (ref 5.0–7.5)

## 2017-09-12 ENCOUNTER — Encounter: Payer: Self-pay | Admitting: Internal Medicine

## 2017-09-12 ENCOUNTER — Encounter: Payer: Self-pay | Admitting: *Deleted

## 2017-09-12 LAB — HEPATITIS C ANTIBODY
HEP C AB: NONREACTIVE
SIGNAL TO CUT-OFF: 0.06 (ref <1.00)

## 2017-09-12 LAB — THYROID PEROXIDASE ANTIBODY: Thyroperoxidase Ab SerPl-aCnc: 82 IU/mL — ABNORMAL HIGH (ref <9)

## 2017-09-13 ENCOUNTER — Other Ambulatory Visit: Payer: Self-pay | Admitting: Internal Medicine

## 2017-09-13 DIAGNOSIS — E039 Hypothyroidism, unspecified: Secondary | ICD-10-CM

## 2017-09-13 MED ORDER — LEVOTHYROXINE SODIUM 25 MCG PO TABS: 25.0000 ug | ORAL_TABLET | Freq: Every day | ORAL | 0 refills | Status: DC

## 2017-09-13 MED ORDER — LEVOTHYROXINE SODIUM 25 MCG PO CAPS: 25.0000 ug | ORAL_CAPSULE | Freq: Every day | ORAL | 0 refills | Status: DC

## 2017-10-12 ENCOUNTER — Encounter: Payer: Self-pay | Admitting: Internal Medicine

## 2017-10-12 ENCOUNTER — Ambulatory Visit: Payer: Medicare Other | Admitting: Internal Medicine

## 2017-10-12 VITALS — BP 166/98 | HR 71 | Temp 98.5°F | Ht 70.0 in | Wt 271.4 lb

## 2017-10-12 DIAGNOSIS — I10 Essential (primary) hypertension: Secondary | ICD-10-CM | POA: Diagnosis not present

## 2017-10-12 DIAGNOSIS — E785 Hyperlipidemia, unspecified: Secondary | ICD-10-CM

## 2017-10-12 DIAGNOSIS — E039 Hypothyroidism, unspecified: Secondary | ICD-10-CM | POA: Diagnosis not present

## 2017-10-12 DIAGNOSIS — R7303 Prediabetes: Secondary | ICD-10-CM | POA: Insufficient documentation

## 2017-10-12 DIAGNOSIS — E559 Vitamin D deficiency, unspecified: Secondary | ICD-10-CM

## 2017-10-12 DIAGNOSIS — R972 Elevated prostate specific antigen [PSA]: Secondary | ICD-10-CM

## 2017-10-12 MED ORDER — CHLORTHALIDONE 25 MG PO TABS: 12.5000 mg | ORAL_TABLET | Freq: Every day | ORAL | 1 refills | Status: DC

## 2017-10-12 NOTE — Patient Instructions (Addendum)
Chlorthalidone 12.5 mg (1/2 tablet) daily in the am  Stop Lasix   Hypertension Hypertension, commonly called high blood pressure, is when the force of blood pumping through the arteries is too strong. The arteries are the blood vessels that carry blood from the heart throughout the body. Hypertension forces the heart to work harder to pump blood and may cause arteries to become narrow or stiff. Having untreated or uncontrolled hypertension can cause heart attacks, strokes, kidney disease, and other problems. A blood pressure reading consists of a higher number over a lower number. Ideally, your blood pressure should be below 120/80. The first ("top") number is called the systolic pressure. It is a measure of the pressure in your arteries as your heart beats. The second ("bottom") number is called the diastolic pressure. It is a measure of the pressure in your arteries as the heart relaxes. What are the causes? The cause of this condition is not known. What increases the risk? Some risk factors for high blood pressure are under your control. Others are not. Factors you can change  Smoking.  Having type 2 diabetes mellitus, high cholesterol, or both.  Not getting enough exercise or physical activity.  Being overweight.  Having too much fat, sugar, calories, or salt (sodium) in your diet.  Drinking too much alcohol. Factors that are difficult or impossible to change  Having chronic kidney disease.  Having a family history of high blood pressure.  Age. Risk increases with age.  Race. You may be at higher risk if you are African-American.  Gender. Men are at higher risk than women before age 63. After age 54, women are at higher risk than men.  Having obstructive sleep apnea.  Stress. What are the signs or symptoms? Extremely high blood pressure (hypertensive crisis) may cause:  Headache.  Anxiety.  Shortness of breath.  Nosebleed.  Nausea and vomiting.  Severe chest  pain.  Jerky movements you cannot control (seizures).  How is this diagnosed? This condition is diagnosed by measuring your blood pressure while you are seated, with your arm resting on a surface. The cuff of the blood pressure monitor will be placed directly against the skin of your upper arm at the level of your heart. It should be measured at least twice using the same arm. Certain conditions can cause a difference in blood pressure between your right and left arms. Certain factors can cause blood pressure readings to be lower or higher than normal (elevated) for a short period of time:  When your blood pressure is higher when you are in a health care provider's office than when you are at home, this is called white coat hypertension. Most people with this condition do not need medicines.  When your blood pressure is higher at home than when you are in a health care provider's office, this is called masked hypertension. Most people with this condition may need medicines to control blood pressure.  If you have a high blood pressure reading during one visit or you have normal blood pressure with other risk factors:  You may be asked to return on a different day to have your blood pressure checked again.  You may be asked to monitor your blood pressure at home for 1 week or longer.  If you are diagnosed with hypertension, you may have other blood or imaging tests to help your health care provider understand your overall risk for other conditions. How is this treated? This condition is treated by making healthy lifestyle  changes, such as eating healthy foods, exercising more, and reducing your alcohol intake. Your health care provider may prescribe medicine if lifestyle changes are not enough to get your blood pressure under control, and if:  Your systolic blood pressure is above 130.  Your diastolic blood pressure is above 80.  Your personal target blood pressure may vary depending on your  medical conditions, your age, and other factors. Follow these instructions at home: Eating and drinking  Eat a diet that is high in fiber and potassium, and low in sodium, added sugar, and fat. An example eating plan is called the DASH (Dietary Approaches to Stop Hypertension) diet. To eat this way: ? Eat plenty of fresh fruits and vegetables. Try to fill half of your plate at each meal with fruits and vegetables. ? Eat whole grains, such as whole wheat pasta, brown rice, or whole grain bread. Fill about one quarter of your plate with whole grains. ? Eat or drink low-fat dairy products, such as skim milk or low-fat yogurt. ? Avoid fatty cuts of meat, processed or cured meats, and poultry with skin. Fill about one quarter of your plate with lean proteins, such as fish, chicken without skin, beans, eggs, and tofu. ? Avoid premade and processed foods. These tend to be higher in sodium, added sugar, and fat.  Reduce your daily sodium intake. Most people with hypertension should eat less than 1,500 mg of sodium a day.  Limit alcohol intake to no more than 1 drink a day for nonpregnant women and 2 drinks a day for men. One drink equals 12 oz of beer, 5 oz of wine, or 1 oz of hard liquor. Lifestyle  Work with your health care provider to maintain a healthy body weight or to lose weight. Ask what an ideal weight is for you.  Get at least 30 minutes of exercise that causes your heart to beat faster (aerobic exercise) most days of the week. Activities may include walking, swimming, or biking.  Include exercise to strengthen your muscles (resistance exercise), such as pilates or lifting weights, as part of your weekly exercise routine. Try to do these types of exercises for 30 minutes at least 3 days a week.  Do not use any products that contain nicotine or tobacco, such as cigarettes and e-cigarettes. If you need help quitting, ask your health care provider.  Monitor your blood pressure at home as  told by your health care provider.  Keep all follow-up visits as told by your health care provider. This is important. Medicines  Take over-the-counter and prescription medicines only as told by your health care provider. Follow directions carefully. Blood pressure medicines must be taken as prescribed.  Do not skip doses of blood pressure medicine. Doing this puts you at risk for problems and can make the medicine less effective.  Ask your health care provider about side effects or reactions to medicines that you should watch for. Contact a health care provider if:  You think you are having a reaction to a medicine you are taking.  You have headaches that keep coming back (recurring).  You feel dizzy.  You have swelling in your ankles.  You have trouble with your vision. Get help right away if:  You develop a severe headache or confusion.  You have unusual weakness or numbness.  You feel faint.  You have severe pain in your chest or abdomen.  You vomit repeatedly.  You have trouble breathing. Summary  Hypertension is when the force  of blood pumping through your arteries is too strong. If this condition is not controlled, it may put you at risk for serious complications.  Your personal target blood pressure may vary depending on your medical conditions, your age, and other factors. For most people, a normal blood pressure is less than 120/80.  Hypertension is treated with lifestyle changes, medicines, or a combination of both. Lifestyle changes include weight loss, eating a healthy, low-sodium diet, exercising more, and limiting alcohol. This information is not intended to replace advice given to you by your health care provider. Make sure you discuss any questions you have with your health care provider. Document Released: 01/25/2005 Document Revised: 12/24/2015 Document Reviewed: 12/24/2015 Elsevier Interactive Patient Education  [employer].

## 2017-10-12 NOTE — Progress Notes (Signed)
Chief Complaint  Patient presents with  . Follow-up   F/u  1. HTN still elevated on lis 40 and lasix 40 lasix not really making him urinate  2. Reviewed labs elevated PSA 9.42 declines urology, prediabetes 3. Hypothyroidism likely with thyroid Ab elevate on synthyroid 25 mcg qd and tolerating recheck labs in 6 weeks    Review of Systems  Constitutional: Negative for weight loss.  HENT: Negative for hearing loss.   Eyes: Negative for blurred vision.  Respiratory: Negative for shortness of breath.   Cardiovascular: Positive for leg swelling. Negative for chest pain.  Skin: Negative for rash.  Neurological: Negative for headaches.  Psychiatric/Behavioral: Negative for depression.   Past Medical History:  Diagnosis Date  . Chronic kidney disease   . Coronary artery disease    s/p cabg triple bypass in 2011   . Elevated TSH   . Enlarged prostate   . History of influenza   . Hyperlipidemia   . Hypertension    Past Surgical History:  Procedure Laterality Date  . CORONARY ARTERY BYPASS GRAFT     x 3 vessel  . triple bypass  2011   Family History  Problem Relation Age of Onset  . Heart attack Mother   . Heart disease Mother   . Heart disease Father   . Diabetes Sister        type 2   . Kidney disease Sister   . Stroke Maternal Grandmother   . Heart attack Maternal Grandfather   . Stroke Maternal Grandfather   . Heart disease Maternal Grandfather   . Heart attack Paternal Grandmother   . Heart disease Paternal Grandmother   . Stroke Paternal Grandfather   . Diabetes Son        type 1  . Diabetes Grandchild        type 2   Social History   Socioeconomic History  . Marital status: Married    Spouse name: Not on file  . Number of children: Not on file  . Years of education: Not on file  . Highest education level: Not on file  Occupational History  . Not on file  Social Needs  . Financial resource strain: Not on file  . Food insecurity:    Worry: Not on file    Inability: Not on file  . Transportation needs:    Medical: Not on file    Non-medical: Not on file  Tobacco Use  . Smoking status: Former Smoker    Packs/day: 1.50    Years: 30.00    Pack years: 45.00    Types: Cigarettes  . Smokeless tobacco: Never Used  Substance and Sexual Activity  . Alcohol use: No  . Drug use: No  . Sexual activity: Not Currently  Lifestyle  . Physical activity:    Days per week: Not on file    Minutes per session: Not on file  . Stress: Not on file  Relationships  . Social connections:    Talks on phone: Not on file    Gets together: Not on file    Attends religious service: Not on file    Active member of club or organization: Not on file    Attends meetings of clubs or organizations: Not on file    Relationship status: Not on file  . Intimate partner violence:    Fear of current or ex partner: Not on file    Emotionally abused: Not on file    Physically abused: Not on  file    Forced sexual activity: Not on file  Other Topics Concern  . Not on file  Social History Narrative   Married    BS degree chemistry    Kids 50 y.o son    Owns guns, wears seat belt, safe in relationship    Current Meds  Medication Sig  . aspirin 81 MG tablet Take 81 mg by mouth daily.    . furosemide (LASIX) 40 MG tablet Take 1 tablet (40 mg total) by mouth daily as needed.  Marland Kitchen levothyroxine (SYNTHROID, LEVOTHROID) 25 MCG tablet Take 1 tablet (25 mcg total) by mouth daily before breakfast.  . lisinopril (PRINIVIL,ZESTRIL) 20 MG tablet Take 2 tablets (40 mg total) by mouth daily.  . metoprolol tartrate (LOPRESSOR) 25 MG tablet Take 1 tablet (25 mg total) by mouth 2 (two) times daily.   Allergies  Allergen Reactions  . Lipitor [Atorvastatin Calcium]     Muscle aches   Recent Results (from the past 2160 hour(s))  Basic metabolic panel     Status: Abnormal   Collection Time: 09/06/17  9:13 AM  Result Value Ref Range   Glucose 93 65 - 99 mg/dL   BUN 22 8 - 27 mg/dL    Creatinine, Ser 1.57 (H) 0.76 - 1.27 mg/dL   GFR calc non Af Amer 43 (L) >59 mL/min/1.73   GFR calc Af Amer 50 (L) >59 mL/min/1.73   BUN/Creatinine Ratio 14 10 - 24   Sodium 139 134 - 144 mmol/L   Potassium 4.8 3.5 - 5.2 mmol/L   Chloride 96 96 - 106 mmol/L   CO2 26 20 - 29 mmol/L   Calcium 9.4 8.6 - 10.2 mg/dL  TSH     Status: Abnormal   Collection Time: 09/06/17  9:13 AM  Result Value Ref Range   TSH 8.950 (H) 0.450 - 4.500 uIU/mL  T4, free     Status: None   Collection Time: 09/09/17 10:37 AM  Result Value Ref Range   Free T4 0.78 0.60 - 1.60 ng/dL    Comment: Specimens from patients who are undergoing biotin therapy and /or ingesting biotin supplements may contain high levels of biotin.  The higher biotin concentration in these specimens interferes with this Free T4 assay.  Specimens that contain high levels  of biotin may cause false high results for this Free T4 assay.  Please interpret results in light of the total clinical presentation of the patient.    T3, free     Status: None   Collection Time: 09/09/17 10:37 AM  Result Value Ref Range   T3, Free 4.1 2.3 - 4.2 pg/mL  Thyroid peroxidase antibody     Status: Abnormal   Collection Time: 09/09/17 10:37 AM  Result Value Ref Range   Thyroperoxidase Ab SerPl-aCnc 82 (H) <9 IU/mL  Hepatic function panel     Status: None   Collection Time: 09/09/17 10:37 AM  Result Value Ref Range   Total Bilirubin 0.7 0.2 - 1.2 mg/dL   Bilirubin, Direct 0.1 0.0 - 0.3 mg/dL   Alkaline Phosphatase 101 39 - 117 U/L   AST 15 0 - 37 U/L   ALT 17 0 - 53 U/L   Total Protein 7.7 6.0 - 8.3 g/dL   Albumin 4.3 3.5 - 5.2 g/dL  CBC with Differential/Platelet     Status: Abnormal   Collection Time: 09/09/17 10:37 AM  Result Value Ref Range   WBC 8.7 4.0 - 10.5 K/uL   RBC 4.45 4.22 -  5.81 Mil/uL   Hemoglobin 13.6 13.0 - 17.0 g/dL   HCT 40.5 39.0 - 52.0 %   MCV 91.1 78.0 - 100.0 fl   MCHC 33.5 30.0 - 36.0 g/dL   RDW 15.9 (H) 11.5 - 15.5 %    Platelets 278.0 150.0 - 400.0 K/uL   Neutrophils Relative % 70.2 43.0 - 77.0 %   Lymphocytes Relative 18.3 12.0 - 46.0 %   Monocytes Relative 7.1 3.0 - 12.0 %   Eosinophils Relative 2.3 0.0 - 5.0 %   Basophils Relative 2.1 0.0 - 3.0 %   Neutro Abs 6.1 1.4 - 7.7 K/uL   Lymphs Abs 1.6 0.7 - 4.0 K/uL   Monocytes Absolute 0.6 0.1 - 1.0 K/uL   Eosinophils Absolute 0.2 0.0 - 0.7 K/uL   Basophils Absolute 0.2 (H) 0.0 - 0.1 K/uL  Urinalysis, Routine w reflex microscopic     Status: None   Collection Time: 09/09/17 10:37 AM  Result Value Ref Range   Specific Gravity, UA 1.012 1.005 - 1.030   pH, UA 5.0 5.0 - 7.5   Color, UA Yellow Yellow   Appearance Ur Clear Clear   Leukocytes, UA Negative Negative   Protein, UA Negative Negative/Trace   Glucose, UA Negative Negative   Ketones, UA Negative Negative   RBC, UA Negative Negative   Bilirubin, UA Negative Negative   Urobilinogen, Ur 0.2 0.2 - 1.0 mg/dL   Nitrite, UA Negative Negative   Microscopic Examination Comment     Comment: Microscopic not indicated and not performed.  PSA, Medicare     Status: Abnormal   Collection Time: 09/09/17 10:37 AM  Result Value Ref Range   PSA 9.42 (H) 0.10 - 4.00 ng/ml    Comment: Test performed using Access Hybritech PSA Assay, a parmagnetic partical, chemiluminecent immunoassay.  Hepatitis C antibody     Status: None   Collection Time: 09/09/17 10:37 AM  Result Value Ref Range   Hepatitis C Ab NON-REACTIVE NON-REACTI   SIGNAL TO CUT-OFF 0.06 <1.00    Comment: . HCV antibody was non-reactive. There is no laboratory  evidence of HCV infection. . In most cases, no further action is required. However, if recent HCV exposure is suspected, a test for HCV RNA (test code [phone]) is suggested. . For additional information please refer to http://education.[facility].com/faq/FAQ22v1 (This link is being provided for informational/ educational purposes only.) .   Hemoglobin A1c     Status: None    Collection Time: 09/09/17 10:44 AM  Result Value Ref Range   Hgb A1c MFr Bld 6.0 4.6 - 6.5 %    Comment: Glycemic Control Guidelines for People with Diabetes:Non Diabetic:  <6%Goal of Therapy: <7%Additional Action Suggested:  >8%    Objective  Body mass index is 38.94 kg/m. Wt Readings from Last 3 Encounters:  10/12/17 271 lb 6.4 oz (123.1 kg)  09/09/17 271 lb (122.9 kg)  09/06/17 273 lb (123.8 kg)   Temp Readings from Last 3 Encounters:  10/12/17 98.5 F (36.9 C) (Oral)  09/09/17 98.7 F (37.1 C) (Oral)   BP Readings from Last 3 Encounters:  10/12/17 (!) 166/98  09/09/17 (!) 168/88  09/06/17 (!) 148/100   Pulse Readings from Last 3 Encounters:  10/12/17 71  09/09/17 77  09/06/17 78    Physical Exam  Constitutional: He is oriented to person, place, and time. He appears well-developed and well-nourished. He is cooperative.  HENT:  Head: Normocephalic and atraumatic.  Mouth/Throat: Oropharynx is clear and moist and mucous membranes  are normal.  Eyes: Pupils are equal, round, and reactive to light. Conjunctivae are normal.  Cardiovascular: Normal rate, regular rhythm and normal heart sounds.  Trace leg edema  Pulmonary/Chest: Effort normal and breath sounds normal.  Neurological: He is alert and oriented to person, place, and time. Gait normal.  Skin: Skin is warm, dry and intact.  Psychiatric: He has a normal mood and affect. His speech is normal and behavior is normal. Judgment and thought content normal. Cognition and memory are normal.  Nursing note and vitals reviewed.   Assessment   1. HTN/HLD uncontrolled/CAD 2. Elevated PSA  3. Hypothyroidism 4. HM Plan   1. D/c lasix change to chlorthalidone 12.5 mg qd  Check BMET, TSH, lipid, vit D at f/u  2.  Declines urology for now  3. Check tsh cont meds for now  4.  Will get flu shot future  rec get Tdap  Will disc shingrix in future  Check labs future  Will need DRE in future disc at f/u  Denies colonoscopy  will consider cologuard in future pt to think about disc today  CT chest consider former smoker [phone] 1-2 ppd no FH lung cancer reviewed today pt to consider    Provider: Dr. Olivia Mackie McLean-Scocuzza-[facility]

## 2017-10-19 ENCOUNTER — Encounter: Payer: Self-pay | Admitting: Internal Medicine

## 2017-10-21 ENCOUNTER — Other Ambulatory Visit: Payer: Self-pay | Admitting: Cardiovascular Disease

## 2017-10-21 NOTE — Telephone Encounter (Signed)
Spoke with [facility], Lisinopril 20 mg was on hold will ready for pickup, Metoprolol was ready also. Left message on patients voicemail that Lisinopril was ready for pick up.

## 2017-10-21 NOTE — Telephone Encounter (Signed)
°*  STAT* If patient is at the pharmacy, call can be transferred to refill team.   1. Which medications need to be refilled? (please list name of each medication and dose if known)  Lisinopril   2. Which pharmacy/location (including street and city if local pharmacy) is medication to be sent to? [facility] in [place]   3. Do they need a 30 day or 90 day supply? 90 day

## 2017-10-21 NOTE — Telephone Encounter (Signed)
*  STAT* If patient is at the pharmacy, call can be transferred to refill team.   1. Which medications need to be refilled? (please list name of each medication and dose if known)  Lisinopril and Metoprolol   2. Which pharmacy/location (including street and city if local pharmacy) is medication to be sent to? [facility] in [place]   3. Do they need a 30 day or 90 day supply? 90 day

## 2017-10-24 ENCOUNTER — Other Ambulatory Visit: Payer: Self-pay | Admitting: Internal Medicine

## 2017-10-24 DIAGNOSIS — I10 Essential (primary) hypertension: Secondary | ICD-10-CM

## 2017-10-24 MED ORDER — CHLORTHALIDONE 25 MG PO TABS: 25.0000 mg | ORAL_TABLET | Freq: Every day | ORAL | 2 refills | Status: DC

## 2017-11-23 ENCOUNTER — Other Ambulatory Visit (INDEPENDENT_AMBULATORY_CARE_PROVIDER_SITE_OTHER): Payer: Medicare Other

## 2017-11-23 DIAGNOSIS — I10 Essential (primary) hypertension: Secondary | ICD-10-CM

## 2017-11-23 DIAGNOSIS — E559 Vitamin D deficiency, unspecified: Secondary | ICD-10-CM

## 2017-11-23 DIAGNOSIS — E039 Hypothyroidism, unspecified: Secondary | ICD-10-CM

## 2017-11-23 DIAGNOSIS — E785 Hyperlipidemia, unspecified: Secondary | ICD-10-CM

## 2017-11-23 LAB — TSH: TSH: 6.16 u[IU]/mL — ABNORMAL HIGH (ref 0.35–4.50)

## 2017-11-23 LAB — VITAMIN D 25 HYDROXY (VIT D DEFICIENCY, FRACTURES): VITD: 8.57 ng/mL — AB (ref 30.00–100.00)

## 2017-11-23 LAB — LDL CHOLESTEROL, DIRECT: LDL DIRECT: 107 mg/dL

## 2017-11-23 LAB — BASIC METABOLIC PANEL
BUN: 29 mg/dL — ABNORMAL HIGH (ref 6–23)
CHLORIDE: 102 meq/L (ref 96–112)
CO2: 30 mEq/L (ref 19–32)
Calcium: 9.2 mg/dL (ref 8.4–10.5)
Creatinine, Ser: 1.77 mg/dL — ABNORMAL HIGH (ref 0.40–1.50)
GFR: 40.28 mL/min — ABNORMAL LOW (ref >60.00)
Glucose, Bld: 103 mg/dL — ABNORMAL HIGH (ref 70–99)
POTASSIUM: 4.5 meq/L (ref 3.5–5.1)
SODIUM: 139 meq/L (ref 135–145)

## 2017-11-23 LAB — LIPID PANEL
Cholesterol: 179 mg/dL (ref 0–200)
HDL: 32.4 mg/dL — AB (ref >39.00)
NONHDL: 146.27
Total CHOL/HDL Ratio: 6
Triglycerides: 283 mg/dL — ABNORMAL HIGH (ref 0.0–149.0)
VLDL: 56.6 mg/dL — AB (ref 0.0–40.0)

## 2017-11-24 ENCOUNTER — Other Ambulatory Visit: Payer: Self-pay | Admitting: Internal Medicine

## 2017-11-24 DIAGNOSIS — E039 Hypothyroidism, unspecified: Secondary | ICD-10-CM

## 2017-11-24 DIAGNOSIS — E559 Vitamin D deficiency, unspecified: Secondary | ICD-10-CM

## 2017-11-24 MED ORDER — LEVOTHYROXINE SODIUM 50 MCG PO TABS: 50.0000 ug | ORAL_TABLET | Freq: Every day | ORAL | 0 refills | Status: DC

## 2017-11-24 MED ORDER — CHOLECALCIFEROL 1.25 MG (50000 UT) PO CAPS: 50000.0000 [IU] | ORAL_CAPSULE | ORAL | 1 refills | Status: DC

## 2017-12-22 ENCOUNTER — Encounter: Payer: Self-pay | Admitting: Internal Medicine

## 2018-01-18 ENCOUNTER — Ambulatory Visit: Payer: Medicare Other | Admitting: Internal Medicine

## 2018-01-18 ENCOUNTER — Encounter: Payer: Self-pay | Admitting: Internal Medicine

## 2018-01-18 VITALS — BP 156/80 | HR 69 | Temp 98.3°F | Ht 70.0 in | Wt 272.4 lb

## 2018-01-18 DIAGNOSIS — R972 Elevated prostate specific antigen [PSA]: Secondary | ICD-10-CM

## 2018-01-18 DIAGNOSIS — R600 Localized edema: Secondary | ICD-10-CM

## 2018-01-18 DIAGNOSIS — R7303 Prediabetes: Secondary | ICD-10-CM

## 2018-01-18 DIAGNOSIS — N183 Chronic kidney disease, stage 3 unspecified: Secondary | ICD-10-CM

## 2018-01-18 DIAGNOSIS — E782 Mixed hyperlipidemia: Secondary | ICD-10-CM

## 2018-01-18 DIAGNOSIS — E039 Hypothyroidism, unspecified: Secondary | ICD-10-CM

## 2018-01-18 DIAGNOSIS — N1832 Chronic kidney disease, stage 3b: Secondary | ICD-10-CM | POA: Insufficient documentation

## 2018-01-18 DIAGNOSIS — I10 Essential (primary) hypertension: Secondary | ICD-10-CM | POA: Diagnosis not present

## 2018-01-18 DIAGNOSIS — E559 Vitamin D deficiency, unspecified: Secondary | ICD-10-CM

## 2018-01-18 LAB — COMPREHENSIVE METABOLIC PANEL
ALT: 15 U/L (ref 0–53)
AST: 16 U/L (ref 0–37)
Albumin: 4 g/dL (ref 3.5–5.2)
Alkaline Phosphatase: 100 U/L (ref 39–117)
BUN: 22 mg/dL (ref 6–23)
CO2: 31 mEq/L (ref 19–32)
Calcium: 9.4 mg/dL (ref 8.4–10.5)
Chloride: 99 mEq/L (ref 96–112)
Creatinine, Ser: 1.55 mg/dL — ABNORMAL HIGH (ref 0.40–1.50)
GFR: 46.92 mL/min — AB (ref >60.00)
Glucose, Bld: 88 mg/dL (ref 70–99)
Potassium: 4.3 mEq/L (ref 3.5–5.1)
Sodium: 136 mEq/L (ref 135–145)
Total Bilirubin: 0.6 mg/dL (ref 0.2–1.2)
Total Protein: 7.8 g/dL (ref 6.0–8.3)

## 2018-01-18 LAB — TSH: TSH: 2.8 u[IU]/mL (ref 0.35–4.50)

## 2018-01-18 MED ORDER — LISINOPRIL 20 MG PO TABS: 20.0000 mg | ORAL_TABLET | Freq: Two times a day (BID) | ORAL | 3 refills | Status: DC

## 2018-01-18 NOTE — Patient Instructions (Addendum)
Ordered echo for hospital to be done at [facility]   Consider zetia for cholesterol   In 05/2018 start vitamin D3 over the counter 5000 IU daily    Ref. Range 09/09/2017 10:37  PSA Latest Ref Range: 0.10 - 4.00 ng/ml 9.42 (H)  Consider Urology referral in the future  Consider kidney specialist in the future for chronic kidney disease stage 3  Consider Carvediolol (Coreg) instead of metoprolol for better blood pressure control     Chronic Kidney Disease, Adult Chronic kidney disease (CKD) occurs when the kidneys become damaged slowly over a long period of time. The kidneys are a pair of organs that do many important jobs in the body, including:  Removing waste and extra fluid from the blood to make urine.  Making hormones that maintain the amount of fluid in tissues and blood vessels.  Maintaining the right amount of fluids and chemicals in the body.  A small amount of kidney damage may not cause problems, but a large amount of damage may make it hard or impossible for the kidneys to work the way they should. If steps are not taken to slow down kidney damage or to stop it from getting worse, the kidneys may stop working permanently (end-stage renal disease or ESRD). Most of the time, CKD does not go away, but it can often be controlled. People who have CKD are usually able to live normal lives. What are the causes? The most common causes of this condition are diabetes and high blood pressure (hypertension). Other causes include:  Heart and blood vessel (cardiovascular) disease.  Kidney diseases, such as: ? Glomerulonephritis. ? Interstitial nephritis. ? Polycystic kidney disease. ? Renal vascular disease.  Diseases that affect the immune system.  Genetic diseases.  Medicines that damage the kidneys, such as anti-inflammatory medicines.  Being around or being in contact with poisonous (toxic) substances.  A kidney or urinary infection that occurs again and again  (recurs).  Vasculitis. This is swelling or inflammation of the blood vessels.  A problem with urine flow that may be caused by: ? Cancer. ? Having kidney stones more than one time. ? An enlarged prostate, in males.  What increases the risk? You are more likely to develop this condition if you:  Are older than age 76.  Are male.  Are African-American, Hispanic, Asian, [place], or American [place].  Are a current or former smoker.  Are obese.  Have a family history of kidney disease or failure.  Often take medicines that are damaging to the kidneys.  What are the signs or symptoms? Symptoms of this condition include:  Swelling (edema) of the face, legs, ankles, or feet.  Tiredness (lethargy) and having less energy.  Nausea or vomiting.  Confusion or trouble concentrating.  Problems with urination, such as: ? Painful or burning feeling during urination. ? Decreased urine production. ? Frequent urination, especially at night. ? Bloody urine.  Muscle twitches and cramps, especially in the legs.  Shortness of breath.  Weakness.  Loss of appetite.  Metallic taste in the mouth.  Trouble sleeping.  Dry, itchy skin.  A low blood count (anemia).  Pale lining of the eyelids and surface of the eye (conjunctiva).  Symptoms develop slowly and may not be obvious until the kidney damage becomes severe. It is possible to have kidney disease for years without having any symptoms. How is this diagnosed? This condition may be diagnosed based on:  Blood tests.  Urine tests.  Imaging tests, such as an  ultrasound or CT scan.  A test in which a sample of tissue is removed from the kidneys to be examined under a microscope (kidney biopsy).  These test results will help your health care provider determine how serious the CKD is. How is this treated? There is no cure for most cases of this condition, but treatment usually relieves symptoms and prevents or  slows the progression of the disease. Treatment may include:  Making diet changes, which may require you to avoid alcohol, salty foods (sodium), and foods that are high in potassium, calcium, and protein.  Medicines: ? To lower blood pressure. ? To control blood glucose. ? To relieve anemia. ? To relieve swelling. ? To protect your bones. ? To improve the balance of electrolytes in your blood.  Removing toxic waste from the body through types of dialysis, if the kidneys can no longer do their job (kidney failure).  Managing any other conditions that are causing your CKD or making it worse.  Follow these instructions at home: Medicines  Take over-the-counter and prescription medicines only as told by your health care provider. The dose of some medicines that you take may need to be adjusted.  Do not take any new medicines unless approved by your health care provider. Many medicines can worsen your kidney damage.  Do not take any vitamin and mineral supplements unless approved by your health care provider. Many nutritional supplements can worsen your kidney damage. General instructions  Follow your prescribed diet as told by your health care provider.  Do not use any products that contain nicotine or tobacco, such as cigarettes and e-cigarettes. If you need help quitting, ask your health care provider.  Monitor and track your blood pressure at home. Report changes in your blood pressure as told by your health care provider.  If you are being treated for diabetes, monitor and track your blood sugar (blood glucose) levels as told by your health care provider.  Maintain a healthy weight. If you need help with this, ask your health care provider.  Start or continue an exercise plan. Exercise at least 30 minutes a day, 5 days a week.  Keep your immunizations up to date as told by your health care provider.  Keep all follow-up visits as told by your health care provider. This is  important. Where to find more information:  American Association of Kidney Patients: BombTimer.gl  National Kidney Foundation: www.kidney.[place]: https://mathis.com/  [facility]: www.lifeoptions.org and www.kidneyschool.org Contact a health care provider if:  Your symptoms get worse.  You develop new symptoms. Get help right away if:  You develop symptoms of ESRD, which include: ? Headaches. ? Numbness in the hands or feet. ? Easy bruising. ? Frequent hiccups. ? Chest pain. ? Shortness of breath. ? Lack of menstruation, in women.  You have a fever.  You have decreased urine production.  You have pain or bleeding when you urinate. Summary  Chronic kidney disease (CKD) occurs when the kidneys become damaged slowly over a long period of time.  The most common causes of this condition are diabetes and high blood pressure (hypertension).  There is no cure for most cases of this condition, but treatment usually relieves symptoms and prevents or slows the progression of the disease. Treatment may include a combination of medicines and lifestyle changes. This information is not intended to replace advice given to you by your health care provider. Make sure you discuss any questions you have with your  health care provider. Document Released: 11/04/2007 Document Revised: 03/04/2016 Document Reviewed: 03/04/2016 Elsevier Interactive Patient Education  2018 [employer].   Carvedilol tablets What is this medicine? CARVEDILOL (KAR ve dil ol) is a beta-blocker. Beta-blockers reduce the workload on the heart and help it to beat more regularly. This medicine is used to treat high blood pressure and heart failure. This medicine may be used for other purposes; ask your health care provider or pharmacist if you have questions. COMMON BRAND NAME(S): Coreg What should I tell my health care provider before I take this medicine? They need to know if  you have any of these conditions: -circulation problems -diabetes -history of heart attack or heart disease -liver disease -lung or breathing disease, like asthma or emphysema -pheochromocytoma -slow or irregular heartbeat -thyroid disease -an unusual or allergic reaction to carvedilol, other beta-blockers, medicines, foods, dyes, or preservatives -pregnant or trying to get pregnant -breast-feeding How should I use this medicine? Take this medicine by mouth with a glass of water. Follow the directions on the prescription label. It is best to take the tablets with food. Take your doses at regular intervals. Do not take your medicine more often than directed. Do not stop taking except on the advice of your doctor or health care professional. Talk to your pediatrician regarding the use of this medicine in children. Special care may be needed. Overdosage: If you think you have taken too much of this medicine contact a poison control center or emergency room at once. NOTE: This medicine is only for you. Do not share this medicine with others. What if I miss a dose? If you miss a dose, take it as soon as you can. If it is almost time for your next dose, take only that dose. Do not take double or extra doses. What may interact with this medicine? This medicine may interact with the following medications: -certain medicines for blood pressure, heart disease, irregular heart beat -certain medicines for depression, like fluoxetine or paroxetine -certain medicines for diabetes, like glipizide or glyburide -cimetidine -clonidine -cyclosporine -digoxin -MAOIs like Carbex, Eldepryl, Marplan, Nardil, and Parnate -reserpine -rifampin This list may not describe all possible interactions. Give your health care provider a list of all the medicines, herbs, non-prescription drugs, or dietary supplements you use. Also tell them if you smoke, drink alcohol, or use illegal drugs. Some items may interact with  your medicine. What should I watch for while using this medicine? Check your heart rate and blood pressure regularly while you are taking this medicine. Ask your doctor or health care professional what your heart rate and blood pressure should be, and when you should contact him or her. Do not stop taking this medicine suddenly. This could lead to serious heart-related effects. Contact your doctor or health care professional if you have difficulty breathing while taking this drug. Check your weight daily. Ask your doctor or health care professional when you should notify him/her of any weight gain. You may get drowsy or dizzy. Do not drive, use machinery, or do anything that requires mental alertness until you know how this medicine affects you. To reduce the risk of dizzy or fainting spells, do not sit or stand up quickly. Alcohol can make you more drowsy, and increase flushing and rapid heartbeats. Avoid alcoholic drinks. If you have diabetes, check your blood sugar as directed. Tell your doctor if you have changes in your blood sugar while you are taking this medicine. If you are going to have surgery,  tell your doctor or health care professional that you are taking this medicine. What side effects may I notice from receiving this medicine? Side effects that you should report to your doctor or health care professional as soon as possible: -allergic reactions like skin rash, itching or hives, swelling of the face, lips, or tongue -breathing problems -dark urine -irregular heartbeat -swollen legs or ankles -vomiting -yellowing of the eyes or skin Side effects that usually do not require medical attention (report to your doctor or health care professional if they continue or are bothersome): -change in sex drive or performance -diarrhea -dry eyes (especially if wearing contact lenses) -dry, itching skin -headache -nausea -unusually tired This list may not describe all possible side effects.  Call your doctor for medical advice about side effects. You may report side effects to FDA at 1-800-FDA-1088. Where should I keep my medicine? Keep out of the reach of children. Store at room temperature below 30 degrees C (86 degrees F). Protect from moisture. Keep container tightly closed. Throw away any unused medicine after the expiration date. NOTE: This sheet is a summary. It may not cover all possible information. If you have questions about this medicine, talk to your doctor, pharmacist, or health care provider.  2018 Elsevier/Gold Standard (2012-10-01 14:12:02)   Cholesterol Cholesterol is a white, waxy, fat-like substance that is needed by the human body in small amounts. The liver makes all the cholesterol we need. Cholesterol is carried from the liver by the blood through the blood vessels. Deposits of cholesterol (plaques) may build up on blood vessel (artery) walls. Plaques make the arteries narrower and stiffer. Cholesterol plaques increase the risk for heart attack and stroke. You cannot feel your cholesterol level even if it is very high. The only way to know that it is high is to have a blood test. Once you know your cholesterol levels, you should keep a record of the test results. Work with your health care provider to keep your levels in the desired range. What do the results mean?  Total cholesterol is a rough measure of all the cholesterol in your blood.  LDL (low-density lipoprotein) is the "bad" cholesterol. This is the type that causes plaque to build up on the artery walls. You want this level to be low.  HDL (high-density lipoprotein) is the "good" cholesterol because it cleans the arteries and carries the LDL away. You want this level to be high.  Triglycerides are fat that the body can either burn for energy or store. High levels are closely linked to heart disease. What are the desired levels of cholesterol?  Total cholesterol below 200.  LDL below 100 for  people who are at risk, below 70 for people at very high risk.  HDL above 40 is good. A level of 60 or higher is considered to be protective against heart disease.  Triglycerides below 150. How can I lower my cholesterol? Diet Follow your diet program as told by your health care provider.  Choose fish or white meat chicken and [place], roasted or baked. Limit fatty cuts of red meat, fried foods, and processed meats, such as sausage and lunch meats.  Eat lots of fresh fruits and vegetables.  Choose whole grains, beans, pasta, potatoes, and cereals.  Choose olive oil, corn oil, or canola oil, and use only small amounts.  Avoid butter, mayonnaise, shortening, or palm kernel oils.  Avoid foods with trans fats.  Drink skim or nonfat milk and eat low-fat or nonfat yogurt  and cheeses. Avoid whole milk, cream, ice cream, egg yolks, and full-fat cheeses.  Healthier desserts include angel food cake, ginger snaps, animal crackers, hard candy, popsicles, and low-fat or nonfat frozen yogurt. Avoid pastries, cakes, pies, and cookies.  Exercise  Follow your exercise program as told by your health care provider. A regular program: ? Helps to decrease LDL and raise HDL. ? Helps with weight control.  Do things that increase your activity level, such as gardening, walking, and taking the stairs.  Ask your health care provider about ways that you can be more active in your daily life.  Medicine  Take over-the-counter and prescription medicines only as told by your health care provider. ? Medicine may be prescribed by your health care provider to help lower cholesterol and decrease the risk for heart disease. This is usually done if diet and exercise have failed to bring down cholesterol levels. ? If you have several risk factors, you may need medicine even if your levels are normal.  This information is not intended to replace advice given to you by your health care provider. Make sure you  discuss any questions you have with your health care provider. Document Released: 10/20/2000 Document Revised: 08/23/2015 Document Reviewed: 07/26/2015 Elsevier Interactive Patient Education  2018 [employer].  Zetia/Ezetimibe Tablets What is this medicine? EZETIMIBE (ez ET i mibe) blocks the absorption of cholesterol from the stomach. It can help lower blood cholesterol for patients who are at risk of getting heart disease or a stroke. It is only for patients whose cholesterol level is not controlled by diet. This medicine may be used for other purposes; ask your health care provider or pharmacist if you have questions. COMMON BRAND NAME(S): Zetia What should I tell my health care provider before I take this medicine? They need to know if you have any of these conditions: -liver disease -an unusual or allergic reaction to ezetimibe, medicines, foods, dyes, or preservatives -pregnant or trying to get pregnant -breast-feeding How should I use this medicine? Take this medicine by mouth with a glass of water. Follow the directions on the prescription label. This medicine can be taken with or without food. Take your doses at regular intervals. Do not take your medicine more often than directed. Talk to your pediatrician regarding the use of this medicine in children. Special care may be needed. Overdosage: If you think you have taken too much of this medicine contact a poison control center or emergency room at once. NOTE: This medicine is only for you. Do not share this medicine with others. What if I miss a dose? If you miss a dose, take it as soon as you can. If it is almost time for your next dose, take only that dose. Do not take double or extra doses. What may interact with this medicine? Do not take this medicine with any of the following medications: -fenofibrate -gemfibrozil This medicine may also interact with the following medications: -antacids -cyclosporine -herbal medicines  like red yeast rice -other medicines to lower cholesterol or triglycerides This list may not describe all possible interactions. Give your health care provider a list of all the medicines, herbs, non-prescription drugs, or dietary supplements you use. Also tell them if you smoke, drink alcohol, or use illegal drugs. Some items may interact with your medicine. What should I watch for while using this medicine? Visit your doctor or health care professional for regular checks on your progress. You will need to have your cholesterol levels checked. If  you are also taking some other cholesterol medicines, you will also need to have tests to make sure your liver is working properly. Tell your doctor or health care professional if you get any unexplained muscle pain, tenderness, or weakness, especially if you also have a fever and tiredness. You need to follow a low-cholesterol, low-fat diet while you are taking this medicine. This will decrease your risk of getting heart and blood vessel disease. Exercising and avoiding alcohol and smoking can also help. Ask your doctor or dietician for advice. What side effects may I notice from receiving this medicine? Side effects that you should report to your doctor or health care professional as soon as possible: -allergic reactions like skin rash, itching or hives, swelling of the face, lips, or tongue -dark yellow or brown urine -unusually weak or tired -yellowing of the skin or eyes Side effects that usually do not require medical attention (report to your doctor or health care professional if they continue or are bothersome): -diarrhea -dizziness -headache -stomach upset or pain This list may not describe all possible side effects. Call your doctor for medical advice about side effects. You may report side effects to FDA at 1-800-FDA-1088. Where should I keep my medicine? Keep out of the reach of children. Store at room temperature between 15 and 30 degrees  C (59 and 86 degrees F). Protect from moisture. Keep container tightly closed. Throw away any unused medicine after the expiration date. NOTE: This sheet is a summary. It may not cover all possible information. If you have questions about this medicine, talk to your doctor, pharmacist, or health care provider.  2018 Elsevier/Gold Standard (2011-08-02 15:39:09)

## 2018-01-18 NOTE — Progress Notes (Signed)
Pre visit review using our clinic review tool, if applicable. No additional management support is needed unless otherwise documented below in the visit note. 

## 2018-01-18 NOTE — Progress Notes (Addendum)
Chief Complaint  Patient presents with  . Follow-up   F/u  1. HTN still uncontrolled but improved on chlorthalidone 25 mg qd, lis 20 mg bid, lopressor 25 bid he is avoiding salt in excess. At home readings 130s-150s/70s.  2. HLD s/p CABG he declines statin and hesistant on zetia  3. Hypothyroidism energy is better will repeat TSH today  4. Elevated PSA declines urology for now he is urinating every 2 hours at night  5.CKD 3 with FH CKD disc risk factors for now wants to think about kidney MD  6. B/l leg edema and wants to know the cause    Review of Systems  Constitutional: Negative for weight loss.  HENT: Negative for hearing loss.   Eyes: Negative for blurred vision.  Respiratory: Negative for shortness of breath.   Cardiovascular: Positive for leg swelling. Negative for chest pain.  Gastrointestinal: Negative for abdominal pain.  Genitourinary:       +nocturia    Musculoskeletal: Negative for falls.  Skin: Negative for rash.  Neurological: Negative for headaches.  Psychiatric/Behavioral: Negative for depression.   Past Medical History:  Diagnosis Date  . Chronic kidney disease   . Coronary artery disease    s/p cabg triple bypass in 2011   . Elevated TSH   . Enlarged prostate   . History of influenza   . Hyperlipidemia   . Hypertension    Past Surgical History:  Procedure Laterality Date  . CORONARY ARTERY BYPASS GRAFT     x 3 vessel  . triple bypass  2011   Family History  Problem Relation Age of Onset  . Heart attack Mother   . Heart disease Mother   . Heart disease Father   . Diabetes Sister        type 2   . Kidney disease Sister   . Stroke Maternal Grandmother   . Heart attack Maternal Grandfather   . Stroke Maternal Grandfather   . Heart disease Maternal Grandfather   . Heart attack Paternal Grandmother   . Heart disease Paternal Grandmother   . Stroke Paternal Grandfather   . Diabetes Son        type 1  . Diabetes Grandchild        type 2    Social History   Socioeconomic History  . Marital status: Married    Spouse name: Not on file  . Number of children: Not on file  . Years of education: Not on file  . Highest education level: Not on file  Occupational History  . Not on file  Social Needs  . Financial resource strain: Not on file  . Food insecurity:    Worry: Not on file    Inability: Not on file  . Transportation needs:    Medical: Not on file    Non-medical: Not on file  Tobacco Use  . Smoking status: Former Smoker    Packs/day: 1.50    Years: 30.00    Pack years: 45.00    Types: Cigarettes  . Smokeless tobacco: Never Used  Substance and Sexual Activity  . Alcohol use: No  . Drug use: No  . Sexual activity: Not Currently  Lifestyle  . Physical activity:    Days per week: Not on file    Minutes per session: Not on file  . Stress: Not on file  Relationships  . Social connections:    Talks on phone: Not on file    Gets together: Not on file  Attends religious service: Not on file    Active member of club or organization: Not on file    Attends meetings of clubs or organizations: Not on file    Relationship status: Not on file  . Intimate partner violence:    Fear of current or ex partner: Not on file    Emotionally abused: Not on file    Physically abused: Not on file    Forced sexual activity: Not on file  Other Topics Concern  . Not on file  Social History Narrative   Married    BS degree chemistry    Kids 31 y.o son    Owns guns, wears seat belt, safe in relationship    Current Meds  Medication Sig  . aspirin 81 MG tablet Take 81 mg by mouth daily.    . chlorthalidone (HYGROTON) 25 MG tablet Take 1 tablet (25 mg total) by mouth daily.  . Cholecalciferol 50000 units capsule Take 1 capsule (50,000 Units total) by mouth once a week.  . levothyroxine (SYNTHROID, LEVOTHROID) 50 MCG tablet Take 1 tablet (50 mcg total) by mouth daily before breakfast.  . lisinopril (PRINIVIL,ZESTRIL) 20 MG  tablet Take 2 tablets (40 mg total) by mouth daily.  . metoprolol tartrate (LOPRESSOR) 25 MG tablet Take 1 tablet (25 mg total) by mouth 2 (two) times daily.   Allergies  Allergen Reactions  . Lipitor [Atorvastatin Calcium]     Muscle aches   Recent Results (from the past 2160 hour(s))  Vitamin D (25 hydroxy)     Status: Abnormal   Collection Time: 11/23/17  9:04 AM  Result Value Ref Range   VITD 8.57 (L) 30.00 - 100.00 ng/mL  Lipid panel     Status: Abnormal   Collection Time: 11/23/17  9:04 AM  Result Value Ref Range   Cholesterol 179 0 - 200 mg/dL    Comment: ATP III Classification       Desirable:  < 200 mg/dL               Borderline High:  200 - 239 mg/dL          High:  > = 240 mg/dL   Triglycerides 283.0 (H) 0.0 - 149.0 mg/dL    Comment: Normal:  <150 mg/dLBorderline High:  150 - 199 mg/dL   HDL 32.40 (L) >39.00 mg/dL   VLDL 56.6 (H) 0.0 - 40.0 mg/dL   Total CHOL/HDL Ratio 6     Comment:                Men          Women1/2 Average Risk     3.4          3.3Average Risk          5.0          4.42X Average Risk          9.6          7.13X Average Risk          15.0          11.0                       NonHDL 146.27     Comment: NOTE:  Non-HDL goal should be 30 mg/dL higher than patient's LDL goal (i.e. LDL goal of < 70 mg/dL, would have non-HDL goal of < 100 mg/dL)  Basic Metabolic Panel (BMET)     Status: Abnormal  Collection Time: 11/23/17  9:04 AM  Result Value Ref Range   Sodium 139 135 - 145 mEq/L   Potassium 4.5 3.5 - 5.1 mEq/L   Chloride 102 96 - 112 mEq/L   CO2 30 19 - 32 mEq/L   Glucose, Bld 103 (H) 70 - 99 mg/dL   BUN 29 (H) 6 - 23 mg/dL   Creatinine, Ser 1.77 (H) 0.40 - 1.50 mg/dL   Calcium 9.2 8.4 - 10.5 mg/dL   GFR 40.28 (L) >60.00 mL/min  TSH     Status: Abnormal   Collection Time: 11/23/17  9:04 AM  Result Value Ref Range   TSH 6.16 (H) 0.35 - 4.50 uIU/mL  LDL cholesterol, direct     Status: None   Collection Time: 11/23/17  9:04 AM  Result Value Ref  Range   Direct LDL 107.0 mg/dL    Comment: Optimal:  <100 mg/dLNear or Above Optimal:  100-129 mg/dLBorderline High:  130-159 mg/dLHigh:  160-189 mg/dLVery High:  >190 mg/dL   Objective  Body mass index is 39.09 kg/m. Wt Readings from Last 3 Encounters:  01/18/18 272 lb 6.4 oz (123.6 kg)  10/12/17 271 lb 6.4 oz (123.1 kg)  09/09/17 271 lb (122.9 kg)   Temp Readings from Last 3 Encounters:  01/18/18 98.3 F (36.8 C) (Oral)  10/12/17 98.5 F (36.9 C) (Oral)  09/09/17 98.7 F (37.1 C) (Oral)   BP Readings from Last 3 Encounters:  01/18/18 (!) 156/80  10/12/17 (!) 166/98  09/09/17 (!) 168/88   Pulse Readings from Last 3 Encounters:  01/18/18 69  10/12/17 71  09/09/17 77    Physical Exam Vitals signs and nursing note reviewed.  Constitutional:      General: Vital signs are normal.     Appearance: He is well-developed and well-nourished.  HENT:     Head: Normocephalic and atraumatic.     Mouth/Throat:     Mouth: Oropharynx is clear and moist and mucous membranes are normal.  Eyes:     Conjunctiva/sclera: Conjunctivae normal.     Pupils: Pupils are equal, round, and reactive to light.  Cardiovascular:     Rate and Rhythm: Normal rate and regular rhythm.     Heart sounds: Normal heart sounds.     Comments: 1-2+ leg edema b/l  Pulmonary:     Effort: Pulmonary effort is normal.     Breath sounds: Normal breath sounds.  Skin:    General: Skin is warm, dry and intact.  Neurological:     Mental Status: He is alert and oriented to person, place, and time.     Gait: Gait normal.  Psychiatric:        Mood and Affect: Mood and affect normal.        Speech: Speech normal.        Behavior: Behavior normal. Behavior is cooperative.        Thought Content: Thought content normal.        Cognition and Memory: Cognition and memory normal.        Judgment: Judgment normal.     Assessment   1. HTN/HLD h/o CAD s/p CABG 2. Hypothyroidism  3. Elevated PSA  4. CKD 3 creatinine  and gfr stable today 1.55, gfr stage 3 ckd  5. Leg edema ddx CKD 3 vs chronic diastolic CHF  6. HM Plan   1. Check cmet today  Cont meds  Consider change BB to coreg pt declines  Disc consider zetia  2. Check tsh today  Cont levothyroxine 50 mcg qd  3. rec consider urology pt declines for now  4. rec consider renal  hydration 5. Echo of heart to see if has CHF pt my chart message and declined echo for now  Consider stronger diuretic I.e torsemide in future  6.  Flu shot had 12/14/17  rec get Tdap  Will disc shingrix in future  PSA elevated  Denies colonoscopy will consider cologuard in future pt to think about disc prev  CT chest consider former smoker [phone] 1-2 ppd no FH lung cancer reviewed today pt to consider Provider: Dr. Olivia Mackie McLean-Scocuzza-[facility] Medicine

## 2018-01-19 ENCOUNTER — Encounter: Payer: Self-pay | Admitting: Internal Medicine

## 2018-01-23 DIAGNOSIS — R972 Elevated prostate specific antigen [PSA]: Secondary | ICD-10-CM

## 2018-01-23 HISTORY — DX: Elevated prostate specific antigen (PSA): R97.20

## 2018-02-28 ENCOUNTER — Other Ambulatory Visit: Payer: Self-pay | Admitting: Internal Medicine

## 2018-02-28 DIAGNOSIS — E039 Hypothyroidism, unspecified: Secondary | ICD-10-CM

## 2018-02-28 DIAGNOSIS — I10 Essential (primary) hypertension: Secondary | ICD-10-CM

## 2018-02-28 MED ORDER — CHLORTHALIDONE 25 MG PO TABS: 25.0000 mg | ORAL_TABLET | Freq: Every day | ORAL | 0 refills | Status: DC

## 2018-02-28 MED ORDER — LEVOTHYROXINE SODIUM 50 MCG PO TABS: 50.0000 ug | ORAL_TABLET | Freq: Every day | ORAL | 0 refills | Status: DC

## 2018-02-28 NOTE — Telephone Encounter (Signed)
Copied from [place]. Topic: Quick Communication - Rx Refill/Question >> Feb 28, 2018  4:34 PM Keene Breath wrote: Medication: chlorthalidone (HYGROTON) 25 MG tablet  Patient called to request a refill for the above medication.  He is out of pills at this time  Preferred Pharmacy (with phone number or street name): [facility] [address], [place] [phone] (Phone) [phone] (Fax)

## 2018-03-02 ENCOUNTER — Other Ambulatory Visit: Payer: Self-pay | Admitting: Internal Medicine

## 2018-03-02 DIAGNOSIS — I10 Essential (primary) hypertension: Secondary | ICD-10-CM

## 2018-03-02 DIAGNOSIS — E039 Hypothyroidism, unspecified: Secondary | ICD-10-CM

## 2018-03-02 MED ORDER — LEVOTHYROXINE SODIUM 50 MCG PO TABS: 50.0000 ug | ORAL_TABLET | Freq: Every day | ORAL | 3 refills | Status: DC

## 2018-03-02 MED ORDER — CHLORTHALIDONE 25 MG PO TABS: 25.0000 mg | ORAL_TABLET | Freq: Every day | ORAL | 1 refills | Status: DC

## 2018-04-19 ENCOUNTER — Ambulatory Visit: Payer: Self-pay | Admitting: Internal Medicine

## 2018-04-26 ENCOUNTER — Ambulatory Visit: Payer: Medicare Other | Admitting: Internal Medicine

## 2018-04-26 ENCOUNTER — Encounter: Payer: Self-pay | Admitting: Internal Medicine

## 2018-04-26 ENCOUNTER — Other Ambulatory Visit: Payer: Self-pay

## 2018-04-26 VITALS — BP 132/86 | HR 86 | Temp 98.9°F | Ht 70.0 in | Wt 273.4 lb

## 2018-04-26 DIAGNOSIS — E039 Hypothyroidism, unspecified: Secondary | ICD-10-CM

## 2018-04-26 DIAGNOSIS — R7303 Prediabetes: Secondary | ICD-10-CM

## 2018-04-26 DIAGNOSIS — R252 Cramp and spasm: Secondary | ICD-10-CM

## 2018-04-26 DIAGNOSIS — R972 Elevated prostate specific antigen [PSA]: Secondary | ICD-10-CM

## 2018-04-26 DIAGNOSIS — Z13220 Encounter for screening for lipoid disorders: Secondary | ICD-10-CM

## 2018-04-26 DIAGNOSIS — M79671 Pain in right foot: Secondary | ICD-10-CM

## 2018-04-26 DIAGNOSIS — M79674 Pain in right toe(s): Secondary | ICD-10-CM

## 2018-04-26 DIAGNOSIS — N183 Chronic kidney disease, stage 3 unspecified: Secondary | ICD-10-CM

## 2018-04-26 DIAGNOSIS — I10 Essential (primary) hypertension: Secondary | ICD-10-CM

## 2018-04-26 DIAGNOSIS — Z1329 Encounter for screening for other suspected endocrine disorder: Secondary | ICD-10-CM

## 2018-04-26 NOTE — Progress Notes (Signed)
Pre visit review using our clinic review tool, if applicable. No additional management support is needed unless otherwise documented below in the visit note. 

## 2018-04-26 NOTE — Patient Instructions (Addendum)
Schedule fasting labs 07/2018 fasting 8-12 hours water and meds   and f/u 1 week after labs    Foot Pain Many things can cause foot pain. Some common causes are:  An injury.  A sprain.  Arthritis.  Blisters.  Bunions. Follow these instructions at home: Pay attention to any changes in your symptoms. Take these actions to help with your discomfort:  If directed, put ice on the affected area: ? Put ice in a plastic bag. ? Place a towel between your skin and the bag. ? Leave the ice on for 15-20 minutes, 3?4 times a day for 2 days.  Take over-the-counter and prescription medicines only as told by your health care provider.  Wear comfortable, supportive shoes that fit you well. Do not wear high heels.  Do not stand or walk for long periods of time.  Do not lift a lot of weight. This can put added pressure on your feet.  Do stretches to relieve foot pain and stiffness as told by your health care provider.  Rub your foot gently.  Keep your feet clean and dry. Contact a health care provider if:  Your pain does not get better after a few days of self-care.  Your pain gets worse.  You cannot stand on your foot. Get help right away if:  Your foot is numb or tingling.  Your foot or toes are swollen.  Your foot or toes turn white or blue.  You have warmth and redness along your foot. This information is not intended to replace advice given to you by your health care provider. Make sure you discuss any questions you have with your health care provider. Document Released: 02/21/2015 Document Revised: 07/03/2015 Document Reviewed: 02/20/2014 Elsevier Interactive Patient Education  2019 [employer]  Seborrheic Keratosis A seborrheic keratosis is a common, noncancerous (benign) skin growth. These growths are velvety, waxy, rough, tan, brown, or black spots that appear on the skin. These skin growths can be flat or raised, and scaly. What are the causes? The cause of this  condition is not known. What increases the risk? You are more likely to develop this condition if you:  Have a family history of seborrheic keratosis.  Are 50 or older.  Are pregnant.  Have had estrogen replacement therapy. What are the signs or symptoms? Symptoms of this condition include growths on the face, chest, shoulders, back, or other areas. These growths:  Are usually painless, but may become irritated and itchy.  Can be yellow, brown, black, or other colors.  Are slightly raised or have a flat surface.  Are sometimes rough or wart-like in texture.  Are often velvety or waxy on the surface.  Are round or oval-shaped.  Often occur in groups, but may occur as a single growth. How is this diagnosed? This condition is diagnosed with a medical history and physical exam.  A sample of the growth may be tested (skin biopsy).  You may need to see a skin specialist (dermatologist). How is this treated? Treatment is not usually needed for this condition, unless the growths are irritated or bleed often.  You may also choose to have the growths removed if you do not like their appearance. ? Most commonly, these growths are treated with a procedure in which liquid nitrogen is applied to "freeze" off the growth (cryosurgery). ? They may also be burned off with electricity (electrocautery) or removed by scraping (curettage). Follow these instructions at home:  Watch your growth for any changes.  Keep all follow-up visits as told by your health care provider. This is important.  Do not scratch or pick at the growth or growths. This can cause them to become irritated or infected. Contact a health care provider if:  You suddenly have many new growths.  Your growth bleeds, itches, or hurts.  Your growth suddenly becomes larger or changes color. Summary  A seborrheic keratosis is a common, noncancerous (benign) skin growth.  Treatment is not usually needed for this  condition, unless the growths are irritated or bleed often.  Watch your growth for any changes.  Contact a health care provider if you suddenly have many new growths or your growth suddenly becomes larger or changes color.  Keep all follow-up visits as told by your health care provider. This is important. This information is not intended to replace advice given to you by your health care provider. Make sure you discuss any questions you have with your health care provider. Document Released: 02/27/2010 Document Revised: 06/09/2017 Document Reviewed: 06/09/2017 Elsevier Interactive Patient Education  2019 [employer].

## 2018-04-26 NOTE — Progress Notes (Signed)
Chief Complaint  Patient presents with  . Follow-up   F/u  1. HTN controlled on tooprol 25 mg bid, chlorthalidone 25 mg qd, lis 20 mg bid  2. C/o right midfoot pain intermittent x 4 days took Naproxen and helped and he stopped chlorthalidone for a couple of days which helped. C/o left leg cramp which resolved  3. Elevated PSA wants to wait on urology consult     Review of Systems  Constitutional: Negative for weight loss.  HENT: Negative for hearing loss.   Eyes: Negative for blurred vision.  Respiratory: Negative for shortness of breath.   Cardiovascular: Negative for chest pain.  Gastrointestinal: Negative for abdominal pain.  Musculoskeletal: Positive for joint pain.       +muscle cramps    Skin: Negative for rash.  Neurological: Negative for headaches.  Psychiatric/Behavioral: Negative for depression.   Past Medical History:  Diagnosis Date  . Chronic kidney disease   . Coronary artery disease    s/p cabg triple bypass in 2011   . Elevated PSA   . Elevated TSH   . Enlarged prostate   . History of influenza   . Hyperlipidemia   . Hypertension    Past Surgical History:  Procedure Laterality Date  . CORONARY ARTERY BYPASS GRAFT     x 3 vessel  . triple bypass  2011   Family History  Problem Relation Age of Onset  . Heart attack Mother   . Heart disease Mother   . Heart disease Father   . Diabetes Sister        type 2   . Kidney disease Sister   . Stroke Maternal Grandmother   . Heart attack Maternal Grandfather   . Stroke Maternal Grandfather   . Heart disease Maternal Grandfather   . Heart attack Paternal Grandmother   . Heart disease Paternal Grandmother   . Stroke Paternal Grandfather   . Diabetes Son        type 1  . Diabetes Grandchild        type 2   Social History   Socioeconomic History  . Marital status: Married    Spouse name: Not on file  . Number of children: Not on file  . Years of education: Not on file  . Highest education level:  Not on file  Occupational History  . Not on file  Social Needs  . Financial resource strain: Not on file  . Food insecurity:    Worry: Not on file    Inability: Not on file  . Transportation needs:    Medical: Not on file    Non-medical: Not on file  Tobacco Use  . Smoking status: Former Smoker    Packs/day: 1.50    Years: 30.00    Pack years: 45.00    Types: Cigarettes  . Smokeless tobacco: Never Used  Substance and Sexual Activity  . Alcohol use: No  . Drug use: No  . Sexual activity: Not Currently  Lifestyle  . Physical activity:    Days per week: Not on file    Minutes per session: Not on file  . Stress: Not on file  Relationships  . Social connections:    Talks on phone: Not on file    Gets together: Not on file    Attends religious service: Not on file    Active member of club or organization: Not on file    Attends meetings of clubs or organizations: Not on file  Relationship status: Not on file  . Intimate partner violence:    Fear of current or ex partner: Not on file    Emotionally abused: Not on file    Physically abused: Not on file    Forced sexual activity: Not on file  Other Topics Concern  . Not on file  Social History Narrative   Married    BS degree chemistry    Kids 59 y.o son    Owns guns, wears seat belt, safe in relationship    Current Meds  Medication Sig  . aspirin 81 MG tablet Take 81 mg by mouth daily.    . chlorthalidone (HYGROTON) 25 MG tablet Take 1 tablet (25 mg total) by mouth daily.  . Cholecalciferol 50000 units capsule Take 1 capsule (50,000 Units total) by mouth once a week.  . levothyroxine (SYNTHROID, LEVOTHROID) 50 MCG tablet Take 1 tablet (50 mcg total) by mouth daily before breakfast.  . lisinopril (PRINIVIL,ZESTRIL) 20 MG tablet Take 1 tablet (20 mg total) by mouth 2 (two) times daily.  . metoprolol tartrate (LOPRESSOR) 25 MG tablet Take 1 tablet (25 mg total) by mouth 2 (two) times daily.   Allergies  Allergen  Reactions  . Lipitor [Atorvastatin Calcium]     Muscle aches   No results found for this or any previous visit (from the past 2160 hour(s)). Objective  Body mass index is 39.23 kg/m. Wt Readings from Last 3 Encounters:  04/26/18 273 lb 6.4 oz (124 kg)  01/18/18 272 lb 6.4 oz (123.6 kg)  10/12/17 271 lb 6.4 oz (123.1 kg)   Temp Readings from Last 3 Encounters:  04/26/18 98.9 F (37.2 C) (Oral)  01/18/18 98.3 F (36.8 C) (Oral)  10/12/17 98.5 F (36.9 C) (Oral)   BP Readings from Last 3 Encounters:  04/26/18 132/86  01/18/18 (!) 156/80  10/12/17 (!) 166/98   Pulse Readings from Last 3 Encounters:  04/26/18 86  01/18/18 69  10/12/17 71    Physical Exam Vitals signs and nursing note reviewed.  Constitutional:      Appearance: Normal appearance. He is well-developed and well-groomed. He is obese.  HENT:     Head: Normocephalic and atraumatic.     Nose: Nose normal.     Mouth/Throat:     Mouth: Mucous membranes are moist.     Pharynx: Oropharynx is clear.  Eyes:     Conjunctiva/sclera: Conjunctivae normal.     Pupils: Pupils are equal, round, and reactive to light.  Cardiovascular:     Rate and Rhythm: Normal rate and regular rhythm.     Heart sounds: Normal heart sounds.  Pulmonary:     Effort: Pulmonary effort is normal.     Breath sounds: Normal breath sounds.  Skin:    General: Skin is warm and dry.  Neurological:     General: No focal deficit present.     Mental Status: He is alert and oriented to person, place, and time. Mental status is at baseline.     Gait: Gait normal.  Psychiatric:        Attention and Perception: Attention and perception normal.        Mood and Affect: Mood and affect normal.        Speech: Speech normal.        Behavior: Behavior normal. Behavior is cooperative.        Thought Content: Thought content normal.        Cognition and Memory: Cognition and memory normal.  Judgment: Judgment normal.     Assessment   1. HTN   2. Right midfoot pain, left leg cramps  3. Elevated PSA  4. Hypothyroidism  5. HM Plan   1. Cont meds for now  Check labs fasting 07/2018  2. Consider Xray right foot in future and consider check uric acid  Given high K food list  3. Consider urology in future  4. Cont same dose thyroid medication  5.  Flu shot had 12/14/17  rec get Tdap  Will disc shingrix in future Consider prevnar, pna 23 in future  PSA elevated consider urology in future   Denies colonoscopy  -will consider cologuard in future pt to think about disc prev  CT chest consider former smoker [phone] 1-2 ppd no FH lung cancer reviewed today pt to consider Provider: Dr. Olivia Mackie McLean-Scocuzza-Internal Medicine

## 2018-04-27 ENCOUNTER — Encounter: Payer: Self-pay | Admitting: Internal Medicine

## 2018-07-27 ENCOUNTER — Other Ambulatory Visit (INDEPENDENT_AMBULATORY_CARE_PROVIDER_SITE_OTHER): Payer: Medicare Other

## 2018-07-27 ENCOUNTER — Other Ambulatory Visit: Payer: Self-pay

## 2018-07-27 DIAGNOSIS — Z1329 Encounter for screening for other suspected endocrine disorder: Secondary | ICD-10-CM

## 2018-07-27 DIAGNOSIS — I10 Essential (primary) hypertension: Secondary | ICD-10-CM

## 2018-07-27 DIAGNOSIS — M79671 Pain in right foot: Secondary | ICD-10-CM

## 2018-07-27 DIAGNOSIS — M79674 Pain in right toe(s): Secondary | ICD-10-CM | POA: Diagnosis not present

## 2018-07-27 DIAGNOSIS — R7303 Prediabetes: Secondary | ICD-10-CM

## 2018-07-27 DIAGNOSIS — Z13220 Encounter for screening for lipoid disorders: Secondary | ICD-10-CM

## 2018-07-27 LAB — CBC WITH DIFFERENTIAL/PLATELET
Basophils Absolute: 0.1 10*3/uL (ref 0.0–0.1)
Basophils Relative: 0.9 % (ref 0.0–3.0)
Eosinophils Absolute: 0.3 10*3/uL (ref 0.0–0.7)
Eosinophils Relative: 2.6 % (ref 0.0–5.0)
HCT: 40.2 % (ref 39.0–52.0)
Hemoglobin: 13.3 g/dL (ref 13.0–17.0)
Lymphocytes Relative: 19.6 % (ref 12.0–46.0)
Lymphs Abs: 1.9 10*3/uL (ref 0.7–4.0)
MCHC: 33.1 g/dL (ref 30.0–36.0)
MCV: 90.9 fl (ref 78.0–100.0)
Monocytes Absolute: 0.7 10*3/uL (ref 0.1–1.0)
Monocytes Relative: 7.4 % (ref 3.0–12.0)
Neutro Abs: 6.8 10*3/uL (ref 1.4–7.7)
Neutrophils Relative %: 69.5 % (ref 43.0–77.0)
Platelets: 272 10*3/uL (ref 150.0–400.0)
RBC: 4.42 Mil/uL (ref 4.22–5.81)
RDW: 14.6 % (ref 11.5–15.5)
WBC: 9.7 10*3/uL (ref 4.0–10.5)

## 2018-07-27 LAB — LIPID PANEL
Cholesterol: 174 mg/dL (ref 0–200)
HDL: 33.9 mg/dL — ABNORMAL LOW (ref >39.00)
NonHDL: 139.87
Total CHOL/HDL Ratio: 5
Triglycerides: 324 mg/dL — ABNORMAL HIGH (ref 0.0–149.0)
VLDL: 64.8 mg/dL — ABNORMAL HIGH (ref 0.0–40.0)

## 2018-07-27 LAB — LDL CHOLESTEROL, DIRECT: Direct LDL: 108 mg/dL

## 2018-07-27 LAB — HEMOGLOBIN A1C: Hgb A1c MFr Bld: 6.1 % (ref 4.6–6.5)

## 2018-07-27 LAB — URIC ACID: Uric Acid, Serum: 7.5 mg/dL (ref 4.0–7.8)

## 2018-07-27 LAB — TSH: TSH: 7.13 u[IU]/mL — ABNORMAL HIGH (ref 0.35–4.50)

## 2018-08-03 ENCOUNTER — Ambulatory Visit (INDEPENDENT_AMBULATORY_CARE_PROVIDER_SITE_OTHER): Payer: Medicare Other | Admitting: Internal Medicine

## 2018-08-03 ENCOUNTER — Telehealth: Payer: Self-pay | Admitting: Internal Medicine

## 2018-08-03 ENCOUNTER — Encounter: Payer: Self-pay | Admitting: Internal Medicine

## 2018-08-03 ENCOUNTER — Other Ambulatory Visit: Payer: Self-pay

## 2018-08-03 DIAGNOSIS — I10 Essential (primary) hypertension: Secondary | ICD-10-CM

## 2018-08-03 DIAGNOSIS — E782 Mixed hyperlipidemia: Secondary | ICD-10-CM | POA: Diagnosis not present

## 2018-08-03 DIAGNOSIS — N183 Chronic kidney disease, stage 3 unspecified: Secondary | ICD-10-CM

## 2018-08-03 DIAGNOSIS — Z951 Presence of aortocoronary bypass graft: Secondary | ICD-10-CM | POA: Diagnosis not present

## 2018-08-03 DIAGNOSIS — E559 Vitamin D deficiency, unspecified: Secondary | ICD-10-CM

## 2018-08-03 DIAGNOSIS — M545 Low back pain: Secondary | ICD-10-CM

## 2018-08-03 DIAGNOSIS — E039 Hypothyroidism, unspecified: Secondary | ICD-10-CM | POA: Diagnosis not present

## 2018-08-03 DIAGNOSIS — R7989 Other specified abnormal findings of blood chemistry: Secondary | ICD-10-CM

## 2018-08-03 DIAGNOSIS — G8929 Other chronic pain: Secondary | ICD-10-CM

## 2018-08-03 DIAGNOSIS — R7303 Prediabetes: Secondary | ICD-10-CM

## 2018-08-03 DIAGNOSIS — R972 Elevated prostate specific antigen [PSA]: Secondary | ICD-10-CM

## 2018-08-03 MED ORDER — LEVOTHYROXINE SODIUM 75 MCG PO TABS: 75.0000 ug | ORAL_TABLET | Freq: Every day | ORAL | 0 refills | Status: DC

## 2018-08-03 NOTE — Patient Instructions (Addendum)
Ref. Range 11/23/2017 09:04 01/18/2018 11:01 07/27/2018 08:37  TSH Latest Ref Range: 0.35 - 4.50 uIU/mL 6.16 (H) 2.80 7.13 (H)  Results for Logan [name], Logan [name] (MRN [id number]) as of 08/03/2018 09:15  Ref. Range 07/27/2018 08:37  Hemoglobin A1C Latest Ref Range: 4.6 - 6.5 % 6.1  Results for Logan [name], Logan [name] (MRN [id number]) as of 08/03/2018 09:15  Ref. Range 07/27/2018 08:37  Total CHOL/HDL Ratio Unknown 5  Cholesterol Latest Ref Range: 0 - 200 mg/dL 174  HDL Cholesterol Latest Ref Range: >39.00 mg/dL 33.90 (L)  Direct LDL Latest Units: mg/dL 108.0  NonHDL Unknown 139.87  Triglycerides Latest Ref Range: 0.0 - 149.0 mg/dL 324.0 (H)  VLDL Latest Ref Range: 0.0 - 40.0 mg/dL 64.8 (H)    Cholesterol Cholesterol is a white, waxy, fat-like substance that is needed by the human body in small amounts. The liver makes all the cholesterol we need. Cholesterol is carried from the liver by the blood through the blood vessels. Deposits of cholesterol (plaques) may build up on blood vessel (artery) walls. Plaques make the arteries narrower and stiffer. Cholesterol plaques increase the risk for heart attack and stroke. You cannot feel your cholesterol level even if it is very high. The only way to know that it is high is to have a blood test. Once you know your cholesterol levels, you should keep a record of the test results. Work with your health care provider to keep your levels in the desired range. What do the results mean?  Total cholesterol is a rough measure of all the cholesterol in your blood.  LDL (low-density lipoprotein) is the "bad" cholesterol. This is the type that causes plaque to build up on the artery walls. You want this level to be low.  HDL (high-density lipoprotein) is the "good" cholesterol because it cleans the arteries and carries the LDL away. You want this level to be high.  Triglycerides are fat that the body can either burn for energy or store. High levels are closely linked to  heart disease. What are the desired levels of cholesterol?  Total cholesterol below 200.  LDL below 100 for people who are at risk, below 70 for people at very high risk.  HDL above 40 is good. A level of 60 or higher is considered to be protective against heart disease.  Triglycerides below 150. How can I lower my cholesterol? Diet Follow your diet program as told by your health care provider.  Choose fish or white meat chicken and [place], roasted or baked. Limit fatty cuts of red meat, fried foods, and processed meats, such as sausage and lunch meats.  Eat lots of fresh fruits and vegetables.  Choose whole grains, beans, pasta, potatoes, and cereals.  Choose olive oil, corn oil, or canola oil, and use only small amounts.  Avoid butter, mayonnaise, shortening, or palm kernel oils.  Avoid foods with trans fats.  Drink skim or nonfat milk and eat low-fat or nonfat yogurt and cheeses. Avoid whole milk, cream, ice cream, egg yolks, and full-fat cheeses.  Healthier desserts include angel food cake, ginger snaps, animal crackers, hard candy, popsicles, and low-fat or nonfat frozen yogurt. Avoid pastries, cakes, pies, and cookies.  Exercise  Follow your exercise program as told by your health care provider. A regular program: ? Helps to decrease LDL and raise HDL. ? Helps with weight control.  Do things that increase your activity level, such as gardening, walking, and taking the stairs.  Ask your health care provider  about ways that you can be more active in your daily life. Medicine  Take over-the-counter and prescription medicines only as told by your health care provider. ? Medicine may be prescribed by your health care provider to help lower cholesterol and decrease the risk for heart disease. This is usually done if diet and exercise have failed to bring down cholesterol levels. ? If you have several risk factors, you may need medicine even if your levels are normal. This  information is not intended to replace advice given to you by your health care provider. Make sure you discuss any questions you have with your health care provider. Document Released: 10/20/2000 Document Revised: 08/23/2015 Document Reviewed: 07/26/2015 Elsevier Interactive Patient Education  [employer].

## 2018-08-03 NOTE — Telephone Encounter (Signed)
Please sch fasting labs 09/10/2018   Thanks [place]

## 2018-08-03 NOTE — Progress Notes (Signed)
Telephone Note  I connected with Logan [name]  on 08/03/18 at  9:10 AM EDT by telephone and verified that I am speaking with the correct person using two identifiers.  Location patient: home Location provider:work  Persons participating in the virtual visit: patient, provider  I discussed the limitations of evaluation and management by telemedicine and the availability of in person appointments. The patient expressed understanding and agreed to proceed.   HPI: 1. HTN uncontrolled at times he started taking chlorthalidone 25 mg prn due to foot pain x 2 felt like "broken food" c/w gout as uric acid 7.5 and back pain noted 06/30/18 but he reports he had been working hard in the yard and also took naproxen he is not sure if medication chlorthalidone was the cause of sx's  But each time he takes he has issues but thinks it does he with fluid in legs. Not sure if lasix helped better with fluid control. On lopressor 25 mg bid and lis 20 mg bid BP at times 140/80 and sometimes higher  Declines change in meds for HTN today  2. HLD declines zetia or statin he thinks its diet related  3. Elevated PSA declines urology and declined MRI prostate at this time  4. Chronic back pain with flare 06/2018 noted Xray low back with DDD and arthritis changes in 09/2017 reviewed with pt  5. Hypothyroidism with elevated TSH on levo 50 mcg qd he is compliant with this med. Declines thyriod [place] for now and agreeable to increase dose    ROS: See pertinent positives and negatives per HPI.  Past Medical History:  Diagnosis Date  . Chronic kidney disease   . Coronary artery disease    s/p cabg triple bypass in 2011   . Elevated PSA   . Elevated TSH   . Enlarged prostate   . History of influenza   . Hyperlipidemia   . Hypertension     Past Surgical History:  Procedure Laterality Date  . CORONARY ARTERY BYPASS GRAFT     x 3 vessel  . triple bypass  2011    Family History  Problem Relation Age of Onset  .  Heart attack Mother   . Heart disease Mother   . Heart disease Father   . Diabetes Sister        type 2   . Kidney disease Sister   . Stroke Maternal Grandmother   . Heart attack Maternal Grandfather   . Stroke Maternal Grandfather   . Heart disease Maternal Grandfather   . Heart attack Paternal Grandmother   . Heart disease Paternal Grandmother   . Stroke Paternal Grandfather   . Diabetes Son        type 1  . Diabetes Grandchild        type 2    SOCIAL HX: married    Current Outpatient Medications:  .  aspirin 81 MG tablet, Take 81 mg by mouth daily.  , Disp: , Rfl:  .  Cholecalciferol 50000 units capsule, Take 1 capsule (50,000 Units total) by mouth once a week., Disp: 13 capsule, Rfl: 1 .  levothyroxine (SYNTHROID) 75 MCG tablet, Take 1 tablet (75 mcg total) by mouth daily before breakfast., Disp: 90 tablet, Rfl: 0 .  lisinopril (PRINIVIL,ZESTRIL) 20 MG tablet, Take 1 tablet (20 mg total) by mouth 2 (two) times daily., Disp: 180 tablet, Rfl: 3 .  metoprolol tartrate (LOPRESSOR) 25 MG tablet, Take 1 tablet (25 mg total) by mouth 2 (two) times daily.,  Disp: 180 tablet, Rfl: 3 .  chlorthalidone (HYGROTON) 25 MG tablet, Take 1 tablet (25 mg total) by mouth daily. (Patient not taking: Reported on 08/03/2018), Disp: 90 tablet, Rfl: 1  EXAM:  VITALS per patient if applicable:  GENERAL: alert, oriented, appears well and in no acute distress  PSYCH/NEURO: pleasant and cooperative, no obvious depression or anxiety, speech and thought processing grossly intact  ASSESSMENT AND PLAN:  Discussed the following assessment and plan:  Mixed hyperlipidemia - Plan: Lipid panel, declines zetia or statin   Hypothyroidism, unspecified type - Plan: levothyroxine (SYNTHROID) 75 MCG tablet increased from 50 mcg, TSH early 09/10/18 fasting  Disc thyroid US today pt declines   Essential hypertension - Plan: Comprehensive metabolic panel, Lipid panel Declines change in med though disc'ed losartan  100 mg due to elevated uric acid and toprol xl 100 mg qd  Prev did not tolerate norvasc 2.5  Wants to take chlorthalidone 12.5-25 mg prn   S/P CABG (coronary artery bypass graft) - Plan: Comprehensive metabolic panel, Lipid panel declines statin rec BP control  Chronic bilateral low back pain without sciatica - Plan: see Xray low back 09/2017  Vitamin D deficiency - Plan: Vitamin D (25 hydroxy), repeat with upcoming labs  CKD (chronic kidney disease) stage 3, GFR 30-59 ml/min (HCC) - Plan: check cmet upcoming  Elevated PSA - Plan: Urinalysis, Routine w reflex microscopic, PSA, Medicare ( [place] [facility] only) declines urology disc'ed MRI prostate and pt declines for now but rec in future  Prediabetes - Plan: rec healthy diet and exercise   HM Flu shot had 12/14/17 rec get Tdap  Will disc shingrix in future Consider prevnar, pna 23 in future hep C neg   PSA elevatedconsider urology in future denied in past declines prostate MRI for now   Denies colonoscopy  -will consider cologuard in future pt to think about discprev  CT chest consider former smoker [phone] 1-2 ppd no FH lung cancer reviewed to consider prev discussed     I discussed the assessment and treatment plan with the patient. The patient was provided an opportunity to ask questions and all were answered. The patient agreed with the plan and demonstrated an understanding of the instructions.   The patient was advised to call back or seek an in-person evaluation if the symptoms worsen or if the condition fails to improve as anticipated.  Time spent 25 minutes Delorise Jackson, MD

## 2018-09-11 ENCOUNTER — Other Ambulatory Visit: Payer: Self-pay

## 2018-09-11 ENCOUNTER — Other Ambulatory Visit (INDEPENDENT_AMBULATORY_CARE_PROVIDER_SITE_OTHER): Payer: Medicare Other

## 2018-09-11 DIAGNOSIS — E559 Vitamin D deficiency, unspecified: Secondary | ICD-10-CM

## 2018-09-11 DIAGNOSIS — Z951 Presence of aortocoronary bypass graft: Secondary | ICD-10-CM | POA: Diagnosis not present

## 2018-09-11 DIAGNOSIS — E039 Hypothyroidism, unspecified: Secondary | ICD-10-CM

## 2018-09-11 DIAGNOSIS — I10 Essential (primary) hypertension: Secondary | ICD-10-CM | POA: Diagnosis not present

## 2018-09-11 DIAGNOSIS — E782 Mixed hyperlipidemia: Secondary | ICD-10-CM | POA: Diagnosis not present

## 2018-09-11 DIAGNOSIS — R972 Elevated prostate specific antigen [PSA]: Secondary | ICD-10-CM | POA: Diagnosis not present

## 2018-09-11 LAB — VITAMIN D 25 HYDROXY (VIT D DEFICIENCY, FRACTURES): VITD: 43.46 ng/mL (ref 30.00–100.00)

## 2018-09-11 LAB — COMPREHENSIVE METABOLIC PANEL
ALT: 19 U/L (ref 0–53)
AST: 14 U/L (ref 0–37)
Albumin: 3.9 g/dL (ref 3.5–5.2)
Alkaline Phosphatase: 104 U/L (ref 39–117)
BUN: 24 mg/dL — ABNORMAL HIGH (ref 6–23)
CO2: 28 mEq/L (ref 19–32)
Calcium: 9.6 mg/dL (ref 8.4–10.5)
Chloride: 102 mEq/L (ref 96–112)
Creatinine, Ser: 1.62 mg/dL — ABNORMAL HIGH (ref 0.40–1.50)
GFR: 41.88 mL/min — ABNORMAL LOW (ref >60.00)
Glucose, Bld: 99 mg/dL (ref 70–99)
Potassium: 4.8 mEq/L (ref 3.5–5.1)
Sodium: 139 mEq/L (ref 135–145)
Total Bilirubin: 0.6 mg/dL (ref 0.2–1.2)
Total Protein: 7.1 g/dL (ref 6.0–8.3)

## 2018-09-11 LAB — LIPID PANEL
Cholesterol: 185 mg/dL (ref 0–200)
HDL: 32.5 mg/dL — ABNORMAL LOW (ref >39.00)
NonHDL: 152.3
Total CHOL/HDL Ratio: 6
Triglycerides: 244 mg/dL — ABNORMAL HIGH (ref 0.0–149.0)
VLDL: 48.8 mg/dL — ABNORMAL HIGH (ref 0.0–40.0)

## 2018-09-11 LAB — LDL CHOLESTEROL, DIRECT: Direct LDL: 118 mg/dL

## 2018-09-11 LAB — PSA, MEDICARE: PSA: 7.97 ng/ml — ABNORMAL HIGH (ref 0.10–4.00)

## 2018-09-11 LAB — TSH: TSH: 2.28 u[IU]/mL (ref 0.35–4.50)

## 2018-09-12 LAB — URINALYSIS, ROUTINE W REFLEX MICROSCOPIC
Bacteria, UA: NONE SEEN /HPF
Bilirubin Urine: NEGATIVE
Glucose, UA: NEGATIVE
Hgb urine dipstick: NEGATIVE
Hyaline Cast: NONE SEEN /LPF
Ketones, ur: NEGATIVE
Leukocytes,Ua: NEGATIVE
Nitrite: NEGATIVE
RBC / HPF: NONE SEEN /HPF (ref 0–2)
Specific Gravity, Urine: 1.012 (ref 1.001–1.03)
Squamous Epithelial / HPF: NONE SEEN /HPF (ref <=5)
WBC, UA: NONE SEEN /HPF (ref 0–5)
pH: 6 (ref 5.0–8.0)

## 2018-10-09 ENCOUNTER — Other Ambulatory Visit: Payer: Self-pay | Admitting: Internal Medicine

## 2018-10-09 ENCOUNTER — Encounter: Payer: Self-pay | Admitting: Internal Medicine

## 2018-10-09 DIAGNOSIS — I10 Essential (primary) hypertension: Secondary | ICD-10-CM

## 2018-10-09 MED ORDER — HYDROCHLOROTHIAZIDE 25 MG PO TABS: 25.0000 mg | ORAL_TABLET | Freq: Every day | ORAL | 3 refills | Status: DC

## 2018-10-16 ENCOUNTER — Other Ambulatory Visit: Payer: Self-pay | Admitting: Cardiovascular Disease

## 2018-10-16 DIAGNOSIS — I10 Essential (primary) hypertension: Secondary | ICD-10-CM

## 2018-10-18 ENCOUNTER — Other Ambulatory Visit: Payer: Self-pay | Admitting: Cardiovascular Disease

## 2018-10-18 DIAGNOSIS — I10 Essential (primary) hypertension: Secondary | ICD-10-CM

## 2018-10-18 NOTE — Telephone Encounter (Signed)
Pt overdue for 12 month f/u. Pt last seen 08/2017. Please contact pt for appointment.

## 2018-10-19 ENCOUNTER — Other Ambulatory Visit: Payer: Self-pay

## 2018-10-19 ENCOUNTER — Other Ambulatory Visit: Payer: Self-pay | Admitting: Cardiovascular Disease

## 2018-10-19 DIAGNOSIS — I10 Essential (primary) hypertension: Secondary | ICD-10-CM

## 2018-10-19 MED ORDER — LISINOPRIL 20 MG PO TABS: 20.0000 mg | ORAL_TABLET | Freq: Two times a day (BID) | ORAL | 0 refills | Status: DC

## 2018-10-19 NOTE — Telephone Encounter (Signed)
°*  STAT* If patient is at the pharmacy, call can be transferred to refill team.   1. Which medications need to be refilled? (please list name of each medication and dose if known) lisinopril 20 mg bid  2. Which pharmacy/location (including street and city if local pharmacy) is medication to be sent to? [facility] on [address]  3. Do they need a 30 day or 90 day supply? 90   Pt is scheduled for [place] on 9/23

## 2018-10-20 ENCOUNTER — Other Ambulatory Visit: Payer: Self-pay | Admitting: Cardiovascular Disease

## 2018-10-20 DIAGNOSIS — I10 Essential (primary) hypertension: Secondary | ICD-10-CM

## 2018-10-31 NOTE — Progress Notes (Signed)
Virtual Visit via Video Note   This visit type was conducted due to national recommendations for restrictions regarding the COVID-19 Pandemic (e.g. social distancing) in an effort to limit this patient's exposure and mitigate transmission in our community.  Due to his co-morbid illnesses, this patient is at least at moderate risk for complications without adequate follow up.  This format is felt to be most appropriate for this patient at this time.  All issues noted in this document were discussed and addressed.  A limited physical exam was performed with this format.  Please refer to the patient's chart for his consent to telehealth for [facility].   I connected with  Logan [name] on 11/01/18 by a video enabled telemedicine application and verified that I am speaking with the correct person using two identifiers. I discussed the limitations of evaluation and management by telemedicine. The patient expressed understanding and agreed to proceed.   Evaluation Performed:  Follow-up visit  Date:  11/01/2018   ID:  Logan [name], DOB 05-14-1945, MRN [id number]  Patient Location:  [place] [address]   Provider location:   [facility], [place] office  PCP:  McLean-Scocuzza, Nino Glow, MD  Cardiologist:  Patsy Baltimore   Chief Complaint:  Cramps, arthritis   History of Present Illness:    Logan [name] is a 73 y.o. male who presents via audio/video conferencing for a telehealth visit today.   The patient does not symptoms concerning for COVID-19 infection (fever, chills, cough, or new SHORTNESS OF BREATH).   Patient has a past medical history of chest pain, 10/2009 cath showing severe coronary artery disease including left main disease, normal ejection fraction,  CABG on 11/07/09 (left internal mammary artery to distal left anterior descending coronary artery, saphenous vein graft to first obtuse marginal branch of the left circumflex  coronary artery, saphenous vein graft to distal right coronary artery)  morbid obesity, Chronic diastolic CHF Previous smoker, medication noncompliance Chronic renal sufficiency who presents for follow-up of his coronary artery disease, CABG   lots of questions today Med concerns, who can write scripts Plays with his meds on his own  Having cramps, stopped the HCTZ Previously stopped chlorthalidone Happened 2 x, foot cramp, back cramp  CR 1.6, chronic  Some foot swelling Lasix lasix at home No cramps with lasix Poor diet in the past  Poor diet  no regular exercise Sedentary, poor conditionin  Stopped asa on his own Having anxiety/dreams, blames the meds Cut all his medications in half  does not want a cholesterol medication  Discussed  previous visits   arthritic pain in his lower back,  blames heart pills Can't walk  Total chol 185,  PSA 8 LDL 118  Other past medical history Several years ago weight was 185 pounds, now 280   Prior CV studies:   The following studies were reviewed today:   Past Medical History:  Diagnosis Date  . Chronic kidney disease   . Coronary artery disease    s/p cabg triple bypass in 2011   . Elevated PSA   . Elevated TSH   . Enlarged prostate   . History of influenza   . Hyperlipidemia   . Hypertension    Past Surgical History:  Procedure Laterality Date  . CORONARY ARTERY BYPASS GRAFT     x 3 vessel  . triple bypass  2011     Allergies:   Lipitor [atorvastatin calcium]  Social History   Tobacco Use  . Smoking status: Former Smoker    Packs/day: 1.50    Years: 30.00    Pack years: 45.00    Types: Cigarettes  . Smokeless tobacco: Never Used  Substance Use Topics  . Alcohol use: No  . Drug use: No     Current Outpatient Medications on File Prior to Visit  Medication Sig Dispense Refill  . aspirin 81 MG tablet Take 81 mg by mouth daily.      . hydrochlorothiazide (HYDRODIURIL) 25 MG tablet Take 1 tablet  (25 mg total) by mouth daily. 90 tablet 3  . levothyroxine (SYNTHROID) 75 MCG tablet Take 1 tablet (75 mcg total) by mouth daily before breakfast. 90 tablet 0  . lisinopril (ZESTRIL) 20 MG tablet Take 2 tablets by mouth once daily 180 tablet 0  . metoprolol tartrate (LOPRESSOR) 25 MG tablet Take 1 tablet by mouth twice daily 180 tablet 0   No current facility-administered medications on file prior to visit.      Family Hx: The patient's family history includes Diabetes in his grandchild, sister, and son; Heart attack in his maternal grandfather, mother, and paternal grandmother; Heart disease in his father, maternal grandfather, mother, and paternal grandmother; Kidney disease in his sister; Stroke in his maternal grandfather, maternal grandmother, and paternal grandfather.  ROS:   Please see the history of present illness.    Review of Systems  Constitutional: Negative.   HENT: Negative.   Respiratory: Negative.   Cardiovascular: Negative.   Gastrointestinal: Negative.   Musculoskeletal: Negative.   Neurological: Negative.   Psychiatric/Behavioral: Negative.   All other systems reviewed and are negative.    Labs/Other Tests and Data Reviewed:    Recent Labs: 07/27/2018: Hemoglobin 13.3; Platelets 272.0 09/11/2018: ALT 19; BUN 24; Creatinine, Ser 1.62; Potassium 4.8; Sodium 139; TSH 2.28   Recent Lipid Panel Lab Results  Component Value Date/Time   CHOL 185 09/11/2018 08:41 AM   TRIG 244.0 (H) 09/11/2018 08:41 AM   HDL 32.50 (L) 09/11/2018 08:41 AM   CHOLHDL 6 09/11/2018 08:41 AM   LDLCALC 127 (H) 04/14/2010 09:50 PM   LDLDIRECT 118.0 09/11/2018 08:41 AM    Wt Readings from Last 3 Encounters:  11/01/18 280 lb (127 kg)  04/26/18 273 lb 6.4 oz (124 kg)  01/18/18 272 lb 6.4 oz (123.6 kg)     Exam:    Vital Signs: Vital signs may also be detailed in the HPI BP (!) 143/82   Pulse 81   Ht 5\' 10"  (1.778 m)   Wt 280 lb (127 kg)   BMI 40.18 kg/m   Wt Readings from Last 3  Encounters:  11/01/18 280 lb (127 kg)  04/26/18 273 lb 6.4 oz (124 kg)  01/18/18 272 lb 6.4 oz (123.6 kg)   Temp Readings from Last 3 Encounters:  04/26/18 98.9 F (37.2 C) (Oral)  01/18/18 98.3 F (36.8 C) (Oral)  10/12/17 98.5 F (36.9 C) (Oral)   BP Readings from Last 3 Encounters:  11/01/18 (!) 143/82  04/26/18 132/86  01/18/18 (!) 156/80   Pulse Readings from Last 3 Encounters:  11/01/18 81  04/26/18 86  01/18/18 69     Well nourished, well developed male in no acute distress. Constitutional:  oriented to person, place, and time. No distress.  Head: Normocephalic and atraumatic.  Eyes:  no discharge. No scleral icterus.  Neck: Normal range of motion. Neck supple.  Pulmonary/Chest: No audible wheezing, no distress, appears comfortable Musculoskeletal: Normal  range of motion.  no  tenderness or deformity.  Neurological:   Coordination normal. Full exam not performed Skin:  No rash Psychiatric:  normal mood and affect. behavior is normal. Thought content normal.    ASSESSMENT & PLAN:    Problem List Items Addressed This Visit      Cardiology Problems   Hyperlipidemia   Coronary atherosclerosis - Primary     Other   TOBACCO ABUSE   S/P CABG (coronary artery bypass graft)   Morbid obesity ([place])    Other Visit Diagnoses    Essential hypertension, benign         Atherosclerosis of native coronary artery of native heart without angina pectoris - Plan: EKG 12-Lead  no symptoms of angina. He does not want a statin Stressed importance of aggressive weight loss, blood pressure control Med-noncompliance at times  Essential hypertension, benign -  He is picking and choosing which medications he would like to take Blames many of his insomnia anxiety and other arthritides on the medications Suggested he try HCTZ 1/2 pill QOD Lasix 20 mg on alternate days  Other fatigue Suspect sleep apnea He is blaming the medications on anxiety and sleep disorder  Morbid  obesity due to excess calories ([place]) Blaming the medications for his weight gain over the past 2 years Diet is poor Dietary guide provided  TOBACCO ABUSE Quit smoking  Hyperlipidemia he does not want a cholesterol medication Encouraged weight loss Weight is running high, poor diet  Leg edema  diastolic CHF Plan as above  COVID-19 Education: The signs and symptoms of COVID-19 were discussed with the patient and how to seek care for testing (follow up with PCP or arrange E-visit).  The importance of social distancing was discussed today.  Patient Risk:   After full review of this patients clinical status, I feel that they are at least moderate risk at this time.  Time:   Today, I have spent 45 minutes with the patient with telehealth technology discussing the cardiac and medical problems/diagnoses detailed above   Additional 10 min spent reviewing the chart prior to patient visit today   Medication Adjustments/Labs and Tests Ordered: Current medicines are reviewed at length with the patient today.  Concerns regarding medicines are outlined above.   Tests Ordered: No tests ordered   Medication Changes: No changes made   Disposition: Follow-up in 6 months   Signed, Ida Rogue, MD  [place] [facility] [address], [place], [place] [address]

## 2018-11-01 ENCOUNTER — Telehealth (INDEPENDENT_AMBULATORY_CARE_PROVIDER_SITE_OTHER): Payer: Medicare Other | Admitting: Cardiovascular Disease

## 2018-11-01 ENCOUNTER — Encounter: Payer: Self-pay | Admitting: Cardiovascular Disease

## 2018-11-01 VITALS — BP 143/82 | HR 81 | Ht 70.0 in | Wt 280.0 lb

## 2018-11-01 DIAGNOSIS — R600 Localized edema: Secondary | ICD-10-CM | POA: Diagnosis not present

## 2018-11-01 DIAGNOSIS — Z951 Presence of aortocoronary bypass graft: Secondary | ICD-10-CM

## 2018-11-01 DIAGNOSIS — I25118 Atherosclerotic heart disease of native coronary artery with other forms of angina pectoris: Secondary | ICD-10-CM | POA: Diagnosis not present

## 2018-11-01 DIAGNOSIS — F17200 Nicotine dependence, unspecified, uncomplicated: Secondary | ICD-10-CM

## 2018-11-01 DIAGNOSIS — I10 Essential (primary) hypertension: Secondary | ICD-10-CM | POA: Diagnosis not present

## 2018-11-01 DIAGNOSIS — E782 Mixed hyperlipidemia: Secondary | ICD-10-CM | POA: Diagnosis not present

## 2018-11-01 MED ORDER — FUROSEMIDE 20 MG PO TABS: ORAL_TABLET | ORAL | 1 refills | Status: DC

## 2018-11-01 MED ORDER — HYDROCHLOROTHIAZIDE 25 MG PO TABS: 25.0000 mg | ORAL_TABLET | ORAL | Status: DC

## 2018-11-01 NOTE — Patient Instructions (Signed)
Medication Instructions:  He is having cramping on HCTZ I recommended he take HCTZ 12.5 mg every other day Lasix 20 mg on alternate days He is going to play with his regiment  If you need a refill on your cardiac medications before your next appointment, please call your pharmacy.    Lab work: No new labs needed   If you have labs (blood work) drawn today and your tests are completely normal, you will receive your results only by: Marland Kitchen MyChart Message (if you have MyChart) OR . A paper copy in the mail If you have any lab test that is abnormal or we need to change your treatment, we will call you to review the results.   Testing/Procedures: No new testing needed   Follow-Up: At [facility], you and your health needs are our priority.  As part of our continuing mission to provide you with exceptional heart care, we have created designated Provider Care Teams.  These Care Teams include your primary Cardiologist (physician) and Advanced Practice Providers (APPs -  Physician Assistants and Nurse Practitioners) who all work together to provide you with the care you need, when you need it.  . You will need a follow up appointment in 6  Months, consider virtual if he would like that .   Please call our office 2 months in advance to schedule this appointment.    . Providers on your designated Care Team:   . Murray Hodgkins, NP . Christell Faith, PA-C . Marrianne Mood, PA-C  Any Other Special Instructions Will Be Listed Below (If Applicable).  For educational health videos Log in to : [email or web address] Or : [email or web address], password : [email or web address]

## 2018-11-13 ENCOUNTER — Other Ambulatory Visit: Payer: Self-pay | Admitting: Internal Medicine

## 2018-11-13 DIAGNOSIS — E039 Hypothyroidism, unspecified: Secondary | ICD-10-CM

## 2018-11-14 MED ORDER — LEVOTHYROXINE SODIUM 75 MCG PO TABS: 75.0000 ug | ORAL_TABLET | Freq: Every day | ORAL | 0 refills | Status: DC

## 2018-11-21 MED ORDER — FUROSEMIDE 20 MG PO TABS: ORAL_TABLET | ORAL | 1 refills | Status: DC

## 2018-11-22 ENCOUNTER — Other Ambulatory Visit: Payer: Self-pay | Admitting: Cardiovascular Disease

## 2018-11-22 NOTE — Telephone Encounter (Signed)
°*  STAT* If patient is at the pharmacy, call can be transferred to refill team.   1. Which medications need to be refilled? (please list name of each medication and dose if known) Lasix 20 Mg daily    2. Which pharmacy/location (including street and city if local pharmacy) is medication to be sent to? [facility] in [place]   3. Do they need a 30 day or 90 day supply? 90 day

## 2018-11-23 MED ORDER — FUROSEMIDE 20 MG PO TABS: ORAL_TABLET | ORAL | 0 refills | Status: DC

## 2018-11-23 NOTE — Telephone Encounter (Signed)
Requested Prescriptions   Signed Prescriptions Disp Refills  . furosemide (LASIX) 20 MG tablet 45 tablet 0    Sig: Take 20mg  tablet by mouth daily on the days you do not take HCTZ    Authorizing Provider: Minna Merritts    Ordering User: Raelene Bott, Elfrieda Espino L

## 2019-01-18 ENCOUNTER — Other Ambulatory Visit: Payer: Self-pay | Admitting: Cardiovascular Disease

## 2019-01-18 DIAGNOSIS — I10 Essential (primary) hypertension: Secondary | ICD-10-CM

## 2019-01-19 ENCOUNTER — Other Ambulatory Visit: Payer: Self-pay | Admitting: Internal Medicine

## 2019-01-19 DIAGNOSIS — E039 Hypothyroidism, unspecified: Secondary | ICD-10-CM

## 2019-01-19 MED ORDER — LEVOTHYROXINE SODIUM 75 MCG PO TABS: 75.0000 ug | ORAL_TABLET | Freq: Every day | ORAL | 0 refills | Status: DC

## 2019-03-21 ENCOUNTER — Other Ambulatory Visit: Payer: Self-pay | Admitting: Internal Medicine

## 2019-03-21 DIAGNOSIS — E039 Hypothyroidism, unspecified: Secondary | ICD-10-CM

## 2019-03-22 MED ORDER — LEVOTHYROXINE SODIUM 75 MCG PO TABS: 75.0000 ug | ORAL_TABLET | Freq: Every day | ORAL | 0 refills | Status: DC

## 2019-03-26 ENCOUNTER — Other Ambulatory Visit: Payer: Self-pay | Admitting: Internal Medicine

## 2019-03-26 DIAGNOSIS — E039 Hypothyroidism, unspecified: Secondary | ICD-10-CM

## 2019-03-26 MED ORDER — LEVOTHYROXINE SODIUM 75 MCG PO TABS: 75.0000 ug | ORAL_TABLET | Freq: Every day | ORAL | 3 refills | Status: DC

## 2019-04-23 ENCOUNTER — Other Ambulatory Visit: Payer: Self-pay

## 2019-04-23 DIAGNOSIS — I10 Essential (primary) hypertension: Secondary | ICD-10-CM

## 2019-04-23 MED ORDER — METOPROLOL TARTRATE 25 MG PO TABS: 25.0000 mg | ORAL_TABLET | Freq: Two times a day (BID) | ORAL | 0 refills | Status: DC

## 2019-04-23 MED ORDER — LISINOPRIL 20 MG PO TABS: 20.0000 mg | ORAL_TABLET | Freq: Two times a day (BID) | ORAL | 0 refills | Status: DC

## 2019-04-27 ENCOUNTER — Telehealth: Payer: Self-pay | Admitting: Cardiovascular Disease

## 2019-04-27 NOTE — Telephone Encounter (Deleted)
Patient Consent for Virtual Visit   { TIP  Anything in RED or BLUE will delete when you sign your note. There is no need to delete it or select it by pressing F2.   Please read everything in BLUE to the patient.        :[id number] { PLEASE READ THE FOLLOWING TO THE PATIENT. Then, scroll to the question in red below. If the patient has questions about consent, refer to and read the consent below,    CONSENT FOR VIRTUAL VISIT.  Logan [name], you are scheduled for a virtual visit with your provider today.  Just as we do with appointments in the office, we must obtain your consent to participate.  Your consent will be active for this visit and any virtual visit you may have with one of our providers in the next 365 days.  If you have a MyChart account, I can also send a copy of this consent to you electronically.  All virtual visits are billed to your insurance company just like a traditional visit in the office.  As this is a virtual visit, video technology does not allow for your provider to perform a traditional examination.  This may limit your provider's ability to fully assess your condition.  If your provider identifies any concerns that need to be evaluated in person or the need to arrange testing such as labs, EKG, etc, we will make arrangements to do so.  Although advances in technology are sophisticated, we cannot ensure that it will always work on either your end or our end.  If the connection with a video visit is poor, we may have to switch to a telephone visit.  With either a video or telephone visit, we are not always able to ensure that we have a secure connection.   I need to obtain your verbal consent now.   Are you willing to proceed with your visit today?        :[id number]  {  Did the patient verbally consent to the visit?  Place your cursor on the next line. Then, press F2 or Fn plus F2 to select the list below.  Then, choose YES or NO.  :[id number]   Logan [name] has  provided verbal consent on 04/27/2019 for a virtual visit (video or telephone).   CONSENT FOR VIRTUAL VISIT FOR:  Logan [name]  By participating in this virtual visit I agree to the following:  I hereby voluntarily request, consent and authorize [facility] and its employed or contracted physicians, physician assistants, nurse practitioners or other licensed health care professionals (the Practitioner), to provide me with telemedicine health care services (the "Services") as deemed necessary by the treating Practitioner. I acknowledge and consent to receive the Services by the Practitioner via telemedicine. I understand that the telemedicine visit will involve communicating with the Practitioner through live audiovisual communication technology and the disclosure of certain medical information by electronic transmission. I acknowledge that I have been given the opportunity to request an in-person assessment or other available alternative prior to the telemedicine visit and am voluntarily participating in the telemedicine visit.  I understand that I have the right to withhold or withdraw my consent to the use of telemedicine in the course of my care at any time, without affecting my right to future care or treatment, and that the Practitioner or I may terminate the telemedicine visit at any time. I understand that I have the right to inspect all information obtained and/or  recorded in the course of the telemedicine visit and may receive copies of available information for a reasonable fee.  I understand that some of the potential risks of receiving the Services via telemedicine include:  Marland Kitchen Delay or interruption in medical evaluation due to technological equipment failure or disruption; . Information transmitted may not be sufficient (e.g. poor resolution of images) to allow for appropriate medical decision making by the Practitioner; and/or  . In rare instances, security protocols could fail, causing  a breach of personal health information.  Furthermore, I acknowledge that it is my responsibility to provide information about my medical history, conditions and care that is complete and accurate to the best of my ability. I acknowledge that Practitioner's advice, recommendations, and/or decision may be based on factors not within their control, such as incomplete or inaccurate data provided by me or distortions of diagnostic images or specimens that may result from electronic transmissions. I understand that the practice of medicine is not an exact science and that Practitioner makes no warranties or guarantees regarding treatment outcomes. I acknowledge that a copy of this consent can be made available to me via my patient portal ([facility]), or I can request a printed copy by calling the office of [facility].    I understand that my insurance will be billed for this visit.   I have read or had this consent read to me. . I understand the contents of this consent, which adequately explains the benefits and risks of the Services being provided via telemedicine.  . I have been provided ample opportunity to ask questions regarding this consent and the Services and have had my questions answered to my satisfaction. . I give my informed consent for the services to be provided through the use of telemedicine in my medical care

## 2019-04-27 NOTE — Telephone Encounter (Signed)

## 2019-06-20 ENCOUNTER — Encounter: Payer: Self-pay | Admitting: Cardiovascular Disease

## 2019-06-20 ENCOUNTER — Other Ambulatory Visit: Payer: Self-pay

## 2019-06-20 ENCOUNTER — Telehealth (INDEPENDENT_AMBULATORY_CARE_PROVIDER_SITE_OTHER): Payer: Medicare PPO | Admitting: Cardiovascular Disease

## 2019-06-20 VITALS — BP 152/84 | Ht 70.0 in | Wt 280.0 lb

## 2019-06-20 DIAGNOSIS — Z951 Presence of aortocoronary bypass graft: Secondary | ICD-10-CM

## 2019-06-20 DIAGNOSIS — E782 Mixed hyperlipidemia: Secondary | ICD-10-CM

## 2019-06-20 DIAGNOSIS — I25118 Atherosclerotic heart disease of native coronary artery with other forms of angina pectoris: Secondary | ICD-10-CM | POA: Diagnosis not present

## 2019-06-20 DIAGNOSIS — F17200 Nicotine dependence, unspecified, uncomplicated: Secondary | ICD-10-CM | POA: Diagnosis not present

## 2019-06-20 DIAGNOSIS — R600 Localized edema: Secondary | ICD-10-CM

## 2019-06-20 DIAGNOSIS — I10 Essential (primary) hypertension: Secondary | ICD-10-CM

## 2019-06-20 MED ORDER — DOXAZOSIN MESYLATE 1 MG PO TABS: 1.0000 mg | ORAL_TABLET | Freq: Two times a day (BID) | ORAL | 6 refills | Status: DC

## 2019-06-20 NOTE — Patient Instructions (Addendum)
Medication Instructions:  Please start cardura 1 mg twice a day Hold for SBP <120  If you need a refill on your cardiac medications before your next appointment, please call your pharmacy.    Lab work: No new labs needed   If you have labs (blood work) drawn today and your tests are completely normal, you will receive your results only by: Marland Kitchen MyChart Message (if you have MyChart) OR . A paper copy in the mail If you have any lab test that is abnormal or we need to change your treatment, we will call you to review the results.   Testing/Procedures: No new testing needed   Follow-Up: At [facility], you and your health needs are our priority.  As part of our continuing mission to provide you with exceptional heart care, we have created designated Provider Care Teams.  These Care Teams include your primary Cardiologist (physician) and Advanced Practice Providers (APPs -  Physician Assistants and Nurse Practitioners) who all work together to provide you with the care you need, when you need it.  . You will need a follow up appointment in 6 months   . Providers on your designated Care Team:   . Murray Hodgkins, NP . Christell Faith, PA-C . Marrianne Mood, PA-C  Any Other Special Instructions Will Be Listed Below (If Applicable).  For educational health videos Log in to : [email or web address] Or : [email or web address], password : [email or web address]

## 2019-06-20 NOTE — Progress Notes (Signed)
Virtual Visit via Video Note   This visit type was conducted due to national recommendations for restrictions regarding the COVID-19 Pandemic (e.g. social distancing) in an effort to limit this patient's exposure and mitigate transmission in our community.  Due to his co-morbid illnesses, this patient is at least at moderate risk for complications without adequate follow up.  This format is felt to be most appropriate for this patient at this time.  All issues noted in this document were discussed and addressed.  A limited physical exam was performed with this format.  Please refer to the patient's chart for his consent to telehealth for [facility]   I connected with  Logan [name] on 06/20/19 by a video enabled telemedicine application and verified that I am speaking with the correct person using two identifiers. I discussed the limitations of evaluation and management by telemedicine. The patient expressed understanding and agreed to proceed.   Evaluation Performed:  Follow-up visit  Date:  06/20/2019   ID:  Logan [name], DOB 1946/01/26, MRN [id number]  Patient Location:  [place] [address]   Provider location:   [facility], [place] office  PCP:  McLean-Scocuzza, Nino Glow, MD  Cardiologist:  Patsy Baltimore   Chief Complaint  Patient presents with  . other    6 month f/u discuss medications. Meds reviewed verbally with pt.     History of Present Illness:    Logan [name] is a 74 y.o. male who presents via audio/video conferencing for a telehealth visit today.   The patient does not symptoms concerning for COVID-19 infection (fever, chills, cough, or new SHORTNESS OF BREATH).   Patient has a past medical history of chest pain, 10/2009 cath showing severe coronary artery disease including left main disease, normal ejection fraction,  CABG on 11/07/09 (left internal mammary artery to distal left anterior descending coronary  artery, saphenous vein graft to first obtuse marginal branch of the left circumflex coronary artery, saphenous vein graft to distal right coronary artery)  morbid obesity, Chronic diastolic CHF Previous smoker,quit 2011 medication noncompliance Chronic renal sufficiency who presents for follow-up of his coronary artery disease, CABG  Doing well, No covid Got vaccine Does not get out much anyway No regular exercise program, reports he did a little bit of mowing yesterday  Cramps have been better, worse when overworking HCTZ makes it worse  Last visit stopped the HCTZ (now on and off) Previously stopped chlorthalidone Not taking metoprolol in the morning, only sometimes in the evenings, makes him tired Rarely takes Lasix only when blood pressure high and he is nervous about high blood pressure numbers -Wonders if it is okay to take extra lisinopril for high blood pressure, sometimes running 170 in the evening -Takes aspirin occasionally more for arthritic pain Previously felt aspirin was causing anxiety/dreams -Does not want cholesterol medication  Labs reviewed: LDL 118 PSA 7.97 Total chol 185 CR 1.62, BUN 24, chronic stable    Past Medical History:  Diagnosis Date  . Chronic kidney disease   . Coronary artery disease    s/p cabg triple bypass in 2011   . Elevated PSA   . Elevated TSH   . Enlarged prostate   . History of influenza   . Hyperlipidemia   . Hypertension    Past Surgical History:  Procedure Laterality Date  . CORONARY ARTERY BYPASS GRAFT     x 3 vessel  . triple bypass  2011  Allergies:   Lipitor [atorvastatin calcium]   Social History   Tobacco Use  . Smoking status: Former Smoker    Packs/day: 1.50    Years: 30.00    Pack years: 45.00    Types: Cigarettes  . Smokeless tobacco: Never Used  Substance Use Topics  . Alcohol use: No  . Drug use: No     Current Outpatient Medications on File Prior to Visit  Medication Sig Dispense Refill   . aspirin 81 MG tablet Take 81 mg by mouth. Twice weekly.    . furosemide (LASIX) 20 MG tablet Take 20mg  tablet by mouth daily on the days you do not take HCTZ 45 tablet 0  . hydrochlorothiazide (HYDRODIURIL) 25 MG tablet Take 1 tablet (25 mg total) by mouth every other day.    . levothyroxine (SYNTHROID) 75 MCG tablet Take 1 tablet (75 mcg total) by mouth daily before breakfast. 90 tablet 3  . lisinopril (ZESTRIL) 20 MG tablet Take 1 tablet (20 mg total) by mouth 2 (two) times daily. 180 tablet 0  . metoprolol tartrate (LOPRESSOR) 25 MG tablet Take 1 tablet (25 mg total) by mouth 2 (two) times daily. 180 tablet 0   No current facility-administered medications on file prior to visit.     Family Hx: The patient's family history includes Diabetes in his grandchild, sister, and son; Heart attack in his maternal grandfather, mother, and paternal grandmother; Heart disease in his father, maternal grandfather, mother, and paternal grandmother; Kidney disease in his sister; Stroke in his maternal grandfather, maternal grandmother, and paternal grandfather.  ROS:   Please see the history of present illness.    Review of Systems  Constitutional: Negative.   HENT: Negative.   Respiratory: Negative.   Cardiovascular: Negative.   Gastrointestinal: Negative.   Musculoskeletal: Negative.   Neurological: Negative.   Psychiatric/Behavioral: Negative.   All other systems reviewed and are negative.    Labs/Other Tests and Data Reviewed:    Recent Labs: 07/27/2018: Hemoglobin 13.3; Platelets 272.0 09/11/2018: ALT 19; BUN 24; Creatinine, Ser 1.62; Potassium 4.8; Sodium 139; TSH 2.28   Recent Lipid Panel Lab Results  Component Value Date/Time   CHOL 185 09/11/2018 08:41 AM   TRIG 244.0 (H) 09/11/2018 08:41 AM   HDL 32.50 (L) 09/11/2018 08:41 AM   CHOLHDL 6 09/11/2018 08:41 AM   LDLCALC 127 (H) 04/14/2010 09:50 PM   LDLDIRECT 118.0 09/11/2018 08:41 AM    Wt Readings from Last 3 Encounters:    06/20/19 280 lb (127 kg)  11/01/18 280 lb (127 kg)  04/26/18 273 lb 6.4 oz (124 kg)     Exam:    Vital Signs: Vital signs may also be detailed in the HPI BP (!) 152/84 (BP Location: Left Arm, Patient Position: Sitting, Cuff Size: Normal)   Ht 5\' 10"  (1.778 m)   Wt 280 lb (127 kg)   BMI 40.18 kg/m   Wt Readings from Last 3 Encounters:  06/20/19 280 lb (127 kg)  11/01/18 280 lb (127 kg)  04/26/18 273 lb 6.4 oz (124 kg)   Temp Readings from Last 3 Encounters:  04/26/18 98.9 F (37.2 C) (Oral)  01/18/18 98.3 F (36.8 C) (Oral)  10/12/17 98.5 F (36.9 C) (Oral)   BP Readings from Last 3 Encounters:  06/20/19 (!) 152/84  11/01/18 (!) 143/82  04/26/18 132/86   Pulse Readings from Last 3 Encounters:  11/01/18 81  04/26/18 86  01/18/18 69     Well nourished, well developed male in no  acute distress. Constitutional:  oriented to person, place, and time. No distress.     ASSESSMENT & PLAN:    Problem List Items Addressed This Visit      Cardiology Problems   Hyperlipidemia   Coronary atherosclerosis - Primary     Other   Bilateral leg edema   TOBACCO ABUSE   S/P CABG (coronary artery bypass graft)   Morbid obesity ([place])    Other Visit Diagnoses    Essential hypertension, benign         Atherosclerosis of native coronary artery of native heart without angina pectoris  Currently with no symptoms of angina. No further workup at this time. Continue current medication regimen.Does not want a statin Only takes asa as needed. Long discussion to take daily  Essential hypertension, benign -  Very selective about his medications Given prior history of leg swelling we will avoid calcium channel blockers He is selective with his metoprolol sometimes takes it in the evening but not in the morning Takes HCTZ but not on a regular basis feels it causes gallops or cramping Only takes Lasix periodically We will avoid medications that need to be taken on a regular basis or  with side effects -Recommend he try Cardura/doxazosin 1 mg twice daily He would likely take this as needed for high blood pressure  Other fatigue Suspect sleep apnea Previously felt it was the medications causing his symptoms such as metoprolol  Morbid obesity due to excess calories ([place]) Diet poor, no regular exercise program Recommend he try to stay active through the summer, moderate carbohydrates  TOBACCO ABUSE Quit smoking  Hyperlipidemia he does not want a cholesterol medication Encouraged weight loss Weight is running high, poor diet Very selective with his medications  Leg edema  diastolic CHF Periodically takes HCTZ and Lasix   COVID-19 Education: The signs and symptoms of COVID-19 were discussed with the patient and how to seek care for testing (follow up with PCP or arrange E-visit).  The importance of social distancing was discussed today.  Patient Risk:   After full review of this patients clinical status, I feel that they are at least moderate risk at this time.  Time:   Today, I have spent 25 minutes with the patient with telehealth technology discussing the cardiac and medical problems/diagnoses detailed above   Additional 10 min spent reviewing the chart prior to patient visit today   Medication Adjustments/Labs and Tests Ordered: Current medicines are reviewed at length with the patient today.  Concerns regarding medicines are outlined above.   Tests Ordered: No tests ordered   Medication Changes: No changes made   Disposition: Follow-up in 6 months   Signed, Ida Rogue, MD  [place] Office [address] [place] [address], [place],  [address]

## 2019-07-26 ENCOUNTER — Other Ambulatory Visit: Payer: Self-pay

## 2019-07-26 DIAGNOSIS — I10 Essential (primary) hypertension: Secondary | ICD-10-CM

## 2019-07-26 MED ORDER — METOPROLOL TARTRATE 25 MG PO TABS: 25.0000 mg | ORAL_TABLET | Freq: Two times a day (BID) | ORAL | 3 refills | Status: DC

## 2019-07-26 MED ORDER — LISINOPRIL 20 MG PO TABS: 20.0000 mg | ORAL_TABLET | Freq: Two times a day (BID) | ORAL | 3 refills | Status: DC

## 2019-07-26 NOTE — Telephone Encounter (Signed)
*  STAT* If patient is at the pharmacy, call can be transferred to refill team.   1. Which medications need to be refilled? (please list name of each medication and dose if known)  Metoprolol, Lisinopril  2. Which pharmacy/location (including street and city if local pharmacy) is medication to be sent to? [facility] [place]  3. Do they need a 30 day or 90 day supply? [place]

## 2019-11-02 ENCOUNTER — Encounter: Payer: Self-pay | Admitting: Internal Medicine

## 2019-11-02 ENCOUNTER — Other Ambulatory Visit: Payer: Self-pay

## 2019-11-02 ENCOUNTER — Telehealth (INDEPENDENT_AMBULATORY_CARE_PROVIDER_SITE_OTHER): Payer: Medicare PPO | Admitting: Internal Medicine

## 2019-11-02 VITALS — BP 156/86 | Ht 70.0 in | Wt 264.0 lb

## 2019-11-02 DIAGNOSIS — I10 Essential (primary) hypertension: Secondary | ICD-10-CM

## 2019-11-02 DIAGNOSIS — E559 Vitamin D deficiency, unspecified: Secondary | ICD-10-CM | POA: Diagnosis not present

## 2019-11-02 DIAGNOSIS — R7303 Prediabetes: Secondary | ICD-10-CM

## 2019-11-02 DIAGNOSIS — R972 Elevated prostate specific antigen [PSA]: Secondary | ICD-10-CM | POA: Diagnosis not present

## 2019-11-02 DIAGNOSIS — E039 Hypothyroidism, unspecified: Secondary | ICD-10-CM | POA: Diagnosis not present

## 2019-11-02 MED ORDER — HYDROCHLOROTHIAZIDE 25 MG PO TABS: 25.0000 mg | ORAL_TABLET | Freq: Every day | ORAL | 3 refills | Status: DC

## 2019-11-02 NOTE — Progress Notes (Signed)
Telephone Note  I connected with Logan [name]  on 11/02/19 at 10:30 AM EDT by telephone and verified that I am speaking with the correct person using two identifiers.  Location patient: home Location provider:work or home office Persons participating in the virtual visit: patient, provider  I discussed the limitations of evaluation and management by telemedicine and the availability of in person appointments. The patient expressed understanding and agreed to proceed.   HPI: 1. HTN stopped Cardura 1 mg bid due to changed urine and seemed to be more spotty so stopped on lis 20 mg bid taking hctz 25 mg but may miss 1-2 days a week and metoprolol 25 mg bid 2. Hypothyroidism on levo 75 mg  3. Elevated PSA having dribbling at times feels like emptying bladder but doing well and denies w/u  ROS: See pertinent positives and negatives per HPI.  Past Medical History:  Diagnosis Date  . Chronic kidney disease   . Coronary artery disease    s/p cabg triple bypass in 2011   . Elevated PSA   . Elevated TSH   . Enlarged prostate   . History of influenza   . Hyperlipidemia   . Hypertension     Past Surgical History:  Procedure Laterality Date  . CORONARY ARTERY BYPASS GRAFT     x 3 vessel  . triple bypass  2011     Current Outpatient Medications:  .  aspirin 81 MG tablet, Take 81 mg by mouth. Twice weekly., Disp: , Rfl:  .  hydrochlorothiazide (HYDRODIURIL) 25 MG tablet, Take 1 tablet (25 mg total) by mouth every other day., Disp: , Rfl:  .  levothyroxine (SYNTHROID) 75 MCG tablet, Take 1 tablet (75 mcg total) by mouth daily before breakfast., Disp: 90 tablet, Rfl: 3 .  lisinopril (ZESTRIL) 20 MG tablet, Take 1 tablet (20 mg total) by mouth 2 (two) times daily., Disp: 180 tablet, Rfl: 3 .  metoprolol tartrate (LOPRESSOR) 25 MG tablet, Take 1 tablet (25 mg total) by mouth 2 (two) times daily., Disp: 180 tablet, Rfl: 3 .  NAPROXEN PO, Take by mouth., Disp: , Rfl:  .  doxazosin (CARDURA) 1  MG tablet, Take 1 tablet (1 mg total) by mouth 2 (two) times daily. Hold for systolic blood pressure less than 120 (Patient not taking: Reported on 11/02/2019), Disp: 60 tablet, Rfl: 6 .  furosemide (LASIX) 20 MG tablet, Take 20mg  tablet by mouth daily on the days you do not take HCTZ (Patient not taking: Reported on 11/02/2019), Disp: 45 tablet, Rfl: 0  EXAM:  VITALS per patient if applicable:  GENERAL: alert, oriented, appears well and in no acute distress  PSYCH/NEURO: talkative pleasant and cooperative, no obvious depression or anxiety, speech and thought processing grossly intact  ASSESSMENT AND PLAN:  Discussed the following assessment and plan:  Essential hypertension - Plan: Comprehensive metabolic panel, Lipid panel, CBC with Differential/Platelet Lis 20 mg bid  hctz 25 mg qd most day not taking lasix 20 mg prn Lopressor 25 mg bid  -->declines to change to coreg Not taking cardura 1 bid see HPI BP check with fasting labs   Hypothyroidism, unspecified type - Plan: TSH Levo 75 mcg   Elevated PSA - Plan: Urinalysis, Routine w reflex microscopic, PSA  Prediabetes - Plan: Hemoglobin A1c  Vitamin D deficiency - Plan: Vitamin D (25 hydroxy)  HM Flu shot due had flu in 2006 and was very sick sch rec get Tdap  [employer] last dose 2 or 04/2019 2/2 sch  booster dose Will disc shingrix in future Consider prevnar, pna 23 hep C neg   PSA elevatedconsider urology in futuredenied in past declines prostate MRI 11/02/19 he does not want to know if he has prostate cancer and this is why -check PSA  Denies colonoscopy -declines cologuard  CT chest consider former smoker [phone] 1-2 ppd no FH lung cancer reviewed to consider prev discussed   -we discussed possible serious and likely etiologies, options for evaluation and workup, limitations of telemedicine visit vs in person visit, treatment, treatment risks and precautions.     I discussed the assessment and treatment plan  with the patient. The patient was provided an opportunity to ask questions and all were answered. The patient agreed with the plan and demonstrated an understanding of the instructions.    Time spent 30 min Delorise Jackson, MD

## 2019-11-05 ENCOUNTER — Other Ambulatory Visit (INDEPENDENT_AMBULATORY_CARE_PROVIDER_SITE_OTHER): Payer: Medicare PPO

## 2019-11-05 ENCOUNTER — Other Ambulatory Visit: Payer: Self-pay

## 2019-11-05 DIAGNOSIS — R7303 Prediabetes: Secondary | ICD-10-CM | POA: Diagnosis not present

## 2019-11-05 DIAGNOSIS — R972 Elevated prostate specific antigen [PSA]: Secondary | ICD-10-CM | POA: Diagnosis not present

## 2019-11-05 DIAGNOSIS — E039 Hypothyroidism, unspecified: Secondary | ICD-10-CM | POA: Diagnosis not present

## 2019-11-05 DIAGNOSIS — E559 Vitamin D deficiency, unspecified: Secondary | ICD-10-CM

## 2019-11-05 DIAGNOSIS — I10 Essential (primary) hypertension: Secondary | ICD-10-CM | POA: Diagnosis not present

## 2019-11-05 LAB — COMPREHENSIVE METABOLIC PANEL
ALT: 21 U/L (ref 0–53)
AST: 18 U/L (ref 0–37)
Albumin: 3.8 g/dL (ref 3.5–5.2)
Alkaline Phosphatase: 89 U/L (ref 39–117)
BUN: 31 mg/dL — ABNORMAL HIGH (ref 6–23)
CO2: 27 mEq/L (ref 19–32)
Calcium: 9.4 mg/dL (ref 8.4–10.5)
Chloride: 100 mEq/L (ref 96–112)
Creatinine, Ser: 1.78 mg/dL — ABNORMAL HIGH (ref 0.40–1.50)
GFR: 37.45 mL/min — ABNORMAL LOW (ref >60.00)
Glucose, Bld: 107 mg/dL — ABNORMAL HIGH (ref 70–99)
Potassium: 4.5 mEq/L (ref 3.5–5.1)
Sodium: 137 mEq/L (ref 135–145)
Total Bilirubin: 0.7 mg/dL (ref 0.2–1.2)
Total Protein: 7.1 g/dL (ref 6.0–8.3)

## 2019-11-05 LAB — CBC WITH DIFFERENTIAL/PLATELET
Basophils Absolute: 0.2 10*3/uL — ABNORMAL HIGH (ref 0.0–0.1)
Basophils Relative: 1.5 % (ref 0.0–3.0)
Eosinophils Absolute: 0.3 10*3/uL (ref 0.0–0.7)
Eosinophils Relative: 2.4 % (ref 0.0–5.0)
HCT: 40.4 % (ref 39.0–52.0)
Hemoglobin: 13.5 g/dL (ref 13.0–17.0)
Lymphocytes Relative: 16.7 % (ref 12.0–46.0)
Lymphs Abs: 1.8 10*3/uL (ref 0.7–4.0)
MCHC: 33.4 g/dL (ref 30.0–36.0)
MCV: 93.2 fl (ref 78.0–100.0)
Monocytes Absolute: 0.8 10*3/uL (ref 0.1–1.0)
Monocytes Relative: 7.1 % (ref 3.0–12.0)
Neutro Abs: 7.8 10*3/uL — ABNORMAL HIGH (ref 1.4–7.7)
Neutrophils Relative %: 72.3 % (ref 43.0–77.0)
Platelets: 299 10*3/uL (ref 150.0–400.0)
RBC: 4.34 Mil/uL (ref 4.22–5.81)
RDW: 15.7 % — ABNORMAL HIGH (ref 11.5–15.5)
WBC: 10.8 10*3/uL — ABNORMAL HIGH (ref 4.0–10.5)

## 2019-11-05 LAB — LIPID PANEL
Cholesterol: 186 mg/dL (ref 0–200)
HDL: 32.4 mg/dL — ABNORMAL LOW (ref >39.00)
NonHDL: 153.64
Total CHOL/HDL Ratio: 6
Triglycerides: 337 mg/dL — ABNORMAL HIGH (ref 0.0–149.0)
VLDL: 67.4 mg/dL — ABNORMAL HIGH (ref 0.0–40.0)

## 2019-11-05 LAB — URINALYSIS, ROUTINE W REFLEX MICROSCOPIC
Bilirubin Urine: NEGATIVE
Hgb urine dipstick: NEGATIVE
Ketones, ur: NEGATIVE
Leukocytes,Ua: NEGATIVE
Nitrite: NEGATIVE
RBC / HPF: NONE SEEN (ref 0–?)
Specific Gravity, Urine: 1.015 (ref 1.000–1.030)
Total Protein, Urine: NEGATIVE
Urine Glucose: NEGATIVE
Urobilinogen, UA: 0.2 (ref 0.0–1.0)
pH: 6 (ref 5.0–8.0)

## 2019-11-05 LAB — HEMOGLOBIN A1C: Hgb A1c MFr Bld: 6.1 % (ref 4.6–6.5)

## 2019-11-05 LAB — LDL CHOLESTEROL, DIRECT: Direct LDL: 104 mg/dL

## 2019-11-05 LAB — TSH: TSH: 4.16 u[IU]/mL (ref 0.35–4.50)

## 2019-11-05 LAB — PSA: PSA: 7.74 ng/mL — ABNORMAL HIGH (ref 0.10–4.00)

## 2019-11-05 LAB — VITAMIN D 25 HYDROXY (VIT D DEFICIENCY, FRACTURES): VITD: 32.41 ng/mL (ref 30.00–100.00)

## 2020-02-15 ENCOUNTER — Ambulatory Visit (INDEPENDENT_AMBULATORY_CARE_PROVIDER_SITE_OTHER): Payer: Medicare PPO | Admitting: Nurse Practitioner

## 2020-02-15 ENCOUNTER — Ambulatory Visit: Payer: Medicare PPO | Admitting: Cardiovascular Disease

## 2020-02-15 ENCOUNTER — Other Ambulatory Visit: Payer: Self-pay

## 2020-02-15 ENCOUNTER — Encounter: Payer: Self-pay | Admitting: Nurse Practitioner

## 2020-02-15 VITALS — BP 150/80 | HR 82 | Ht 70.0 in | Wt 278.2 lb

## 2020-02-15 DIAGNOSIS — I5032 Chronic diastolic (congestive) heart failure: Secondary | ICD-10-CM | POA: Diagnosis not present

## 2020-02-15 DIAGNOSIS — E785 Hyperlipidemia, unspecified: Secondary | ICD-10-CM

## 2020-02-15 DIAGNOSIS — I10 Essential (primary) hypertension: Secondary | ICD-10-CM | POA: Diagnosis not present

## 2020-02-15 DIAGNOSIS — M109 Gout, unspecified: Secondary | ICD-10-CM | POA: Diagnosis not present

## 2020-02-15 DIAGNOSIS — I2510 Atherosclerotic heart disease of native coronary artery without angina pectoris: Secondary | ICD-10-CM | POA: Diagnosis not present

## 2020-02-15 MED ORDER — ASPIRIN EC 81 MG PO TBEC: 81.0000 mg | DELAYED_RELEASE_TABLET | Freq: Every day | ORAL | 3 refills | Status: DC

## 2020-02-15 MED ORDER — FUROSEMIDE 20 MG PO TABS
20.0000 mg | ORAL_TABLET | ORAL | 3 refills | Status: DC | PRN
Start: 2020-02-15 — End: 2020-08-15

## 2020-02-15 NOTE — Patient Instructions (Signed)
Medication Instructions:   Take you Aspirin daily.  Take your Lasix when needed for swelling or shortness of breath.  *If you need a refill on your cardiac medications before your next appointment, please call your pharmacy*   Lab Work: None Ordered If you have labs (blood work) drawn today and your tests are completely normal, you will receive your results only by: Marland Kitchen MyChart Message (if you have MyChart) OR . A paper copy in the mail If you have any lab test that is abnormal or we need to change your treatment, we will call you to review the results.   Testing/Procedures: None ORdered   Follow-Up: At [facility], you and your health needs are our priority.  As part of our continuing mission to provide you with exceptional heart care, we have created designated Provider Care Teams.  These Care Teams include your primary Cardiologist (physician) and Advanced Practice Providers (APPs -  Physician Assistants and Nurse Practitioners) who all work together to provide you with the care you need, when you need it.  We recommend signing up for the patient portal called "MyChart".  Sign up information is provided on this After Visit Summary.  MyChart is used to connect with patients for Virtual Visits (Telemedicine).  Patients are able to view lab/test results, encounter notes, upcoming appointments, etc.  Non-urgent messages can be sent to your provider as well.   To learn more about what you can do with MyChart, go to [email or web address].    Your next appointment:   6 month(s)  The format for your next appointment:   In Person  Provider:   Ida Rogue, MD   Other Instructions

## 2020-02-15 NOTE — Progress Notes (Signed)
Office Visit    Patient Name: Logan [name] Date of Encounter: 02/15/2020  Primary Care Provider:  McLean-Scocuzza, Nino Glow, MD Primary Cardiologist:  Ida Rogue, MD  Chief Complaint    75 y/o ? w/ a h/o CAD s/p CABG x3 in September 2011, HFpEF, hypertension, hyperlipidemia, obesity, and stage III chronic kidney disease, who presents for follow-up of CAD, and edema.  Past Medical History    Past Medical History:  Diagnosis Date  . CKD (chronic kidney disease), stage III ([place])   . Coronary artery disease    s/p cabg triple bypass in 2011   . Elevated PSA   . Elevated TSH   . Enlarged prostate   . History of influenza   . Hyperlipidemia   . Hypertension    Past Surgical History:  Procedure Laterality Date  . CORONARY ARTERY BYPASS GRAFT     x 3 vessel  . triple bypass  2011    Allergies  Allergies  Allergen Reactions  . Lipitor [Atorvastatin Calcium]     Muscle aches    History of Present Illness    75 year old male with the above past medical history including CAD status post CABG x3 in September 2011, HFpEF, hypertension, hyperlipidemia, obesity, and stage III chronic kidney disease. He was last seen via telemedicine visit in May 2021, at which time he was relatively stable from a CAD standpoint but continues to have issues with elevated blood pressures. He has always been selective about which medicines he will take and when he takes them. He is fairly open about his desires to come off of all medicines if possible. Since his last visit, he notes chronic, stable dyspnea on exertion. He does not experience chest pain. He has chronic mild lower extremity swelling. He admits to some dietary indiscretion recently which led to increasing pedal edema. Though he normally takes his HCTZ about 3-4 times a week, he took it for 8 straight days and this resulted in left toe pain, and gout. Since then, he has done a lot of reading on the Internet and was shocked to see just  how much salt plays a role in his hypertension and swelling. He has since made an effort to cut salt out of his diet and because he felt that the higher dose of diuretic resulted in gout, he has not been taking his hydrochlorothiazide over the past 5 days. His blood pressure today is 150/80, which he says is better than it usually runs in attributes this to reduced salt intake at home. He has not noticed any significant change in lower extremity edema since coming off of HCTZ. He is not interested in treatment for gout or evaluation of uric acid. He denies palpitations, PND, orthopnea, dizziness, syncope, or early satiety.  Home Medications    Prior to Admission medications   Medication Sig Start Date End Date Taking? Authorizing Provider  aspirin 81 MG tablet Take 81 mg by mouth. Twice weekly.    [provider]  doxazosin (CARDURA) 1 MG tablet Take 1 tablet (1 mg total) by mouth 2 (two) times daily. Hold for systolic blood pressure less than 120 Patient not taking: Reported on 11/02/2019 06/20/19   Minna Merritts, MD  furosemide (LASIX) 20 MG tablet Take 20mg  tablet by mouth daily on the days you do not take HCTZ Patient not taking: Reported on 11/02/2019 11/23/18   Minna Merritts, MD  hydrochlorothiazide (HYDRODIURIL) 25 MG tablet Take 1 tablet (25 mg total) by  mouth daily. 11/02/19   McLean-Scocuzza, Nino Glow, MD  levothyroxine (SYNTHROID) 75 MCG tablet Take 1 tablet (75 mcg total) by mouth daily before breakfast. 03/26/19   McLean-Scocuzza, Nino Glow, MD  lisinopril (ZESTRIL) 20 MG tablet Take 1 tablet (20 mg total) by mouth 2 (two) times daily. 07/26/19   Minna Merritts, MD  metoprolol tartrate (LOPRESSOR) 25 MG tablet Take 1 tablet (25 mg total) by mouth 2 (two) times daily. 07/26/19   Minna Merritts, MD  NAPROXEN PO Take by mouth.    [provider]    Review of Systems    Chronic lower extremity swelling and dyspnea on exertion. He denies chest pain, palpitations,  PND, orthopnea, dizziness, syncope, or early satiety.  All other systems reviewed and are otherwise negative except as noted above.  Physical Exam    VS:  BP (!) 150/80 (BP Location: Left Arm, Patient Position: Sitting, Cuff Size: Large)   Pulse 82   Ht 5\' 10"  (1.778 m)   Wt 278 lb 4 oz (126.2 kg)   SpO2 96%   BMI 39.92 kg/m  , BMI Body mass index is 39.92 kg/m. GEN: Obese, in no acute distress. HEENT: normal. Neck: Supple, obese, difficult to gauge JVP, No carotid bruits, or masses. Cardiac: RRR, no murmurs, rubs, or gallops. No clubbing, cyanosis. 1-2+ bilateral lower extremity edema extending up to the knees. Radials/PT 2+ and equal bilaterally.  Respiratory:  Respirations regular and unlabored, clear to auscultation bilaterally. GI: Obese, soft, nontender, nondistended, BS + x 4. MS: no deformity or atrophy. Skin: warm and dry, no rash. Neuro:  Strength and sensation are intact. Psych: Normal affect.  Accessory Clinical Findings    ECG personally reviewed by me today -regular sinus rhythm, 82, nonspecific T changes - no acute changes.  Lab Results  Component Value Date   WBC 10.8 (H) 11/05/2019   HGB 13.5 11/05/2019   HCT 40.4 11/05/2019   MCV 93.2 11/05/2019   PLT 299.0 11/05/2019   Lab Results  Component Value Date   CREATININE 1.78 (H) 11/05/2019   BUN 31 (H) 11/05/2019   NA 137 11/05/2019   K 4.5 11/05/2019   CL 100 11/05/2019   CO2 27 11/05/2019   Lab Results  Component Value Date   ALT 21 11/05/2019   AST 18 11/05/2019   ALKPHOS 89 11/05/2019   BILITOT 0.7 11/05/2019   Lab Results  Component Value Date   CHOL 186 11/05/2019   HDL 32.40 (L) 11/05/2019   LDLCALC 127 (H) 04/14/2010   LDLDIRECT 104.0 11/05/2019   TRIG 337.0 (H) 11/05/2019   CHOLHDL 6 11/05/2019    Lab Results  Component Value Date   HGBA1C 6.1 11/05/2019    Assessment & Plan    1. Coronary artery disease: Status post CABG x3 in 2011. He continues to be free of angina. He has  chronic, stable dyspnea on exertion. He is currently taking beta-blocker and ACE inhibitor therapy. He only sometimes take aspirin and I encouraged him to take this daily. He is not on a statin due to prior intolerance with myalgias when taking Lipitor. He is not interested in trying an alternate statin, Zetia, or PCSK9 inhibitor.  2. Chronic heart failure with preserved ejection fraction: He has some degree of chronic dyspnea on exertion and lower extremity edema, which he notes have been stable. He has not been taking his hydrochlorothiazide over the past 5 days due to recent gout flare after some dietary indiscretion. He has 1-2+  bilateral lower extremity edema to the knees today. We discussed importance of monitoring sodium intake as well as compliance with medications in order to prevent volume excess. Patient says he is now committed to significantly reducing salt in his diet and prefers to remain off of HCTZ for right now. Advised him to continue to watch his weight daily and have a low threshold to contact us as I suspect he is going to have worsening edema and potentially worsening dyspnea in the near future. I did offer him treatment for gout so that he might feel more comfortable getting back on diuretic however he deferred.  3. Essential hypertension: Pressure elevated at 150/80. He says this is actually good for him and attributes this to being off of diuretic and restricting salt. He would not consider adjusting his medications or going back on diuretic at this time.  4. Acute gout flare: Patient reported dietary indiscretion which led to a higher dose of diuretic at home followed by left toe and foot pain. I offered to check a uric acid and treat him with colchicine acutely while considering daily allopurinol however, he is not interested in taking more medication and prefers to wait this out. Encouraged to contact primary care if he changes his mind.  5. Hyperlipidemia: Intolerant to Lipitor  and unwilling to try any other statin, Zetia, or PCSK9 inhibitor.  6. Morbid obesity: He recognized that he needs to lose weight and says he is committed to cutting his calories and exercising more.  7. Disposition: Follow-up in clinic in 6 months or sooner if necessary.   Murray Hodgkins, NP 02/15/2020, 6:30 PM

## 2020-03-07 NOTE — Telephone Encounter (Signed)
Error. Closing encounter 

## 2020-04-20 ENCOUNTER — Other Ambulatory Visit: Payer: Self-pay | Admitting: Internal Medicine

## 2020-04-20 DIAGNOSIS — E039 Hypothyroidism, unspecified: Secondary | ICD-10-CM

## 2020-05-01 ENCOUNTER — Ambulatory Visit: Payer: Medicare PPO | Admitting: Internal Medicine

## 2020-05-01 ENCOUNTER — Encounter: Payer: Self-pay | Admitting: Internal Medicine

## 2020-05-01 ENCOUNTER — Other Ambulatory Visit: Payer: Self-pay

## 2020-05-01 VITALS — BP 132/88 | HR 74 | Temp 98.1°F | Ht 70.0 in | Wt 273.4 lb

## 2020-05-01 DIAGNOSIS — I872 Venous insufficiency (chronic) (peripheral): Secondary | ICD-10-CM

## 2020-05-01 DIAGNOSIS — R7303 Prediabetes: Secondary | ICD-10-CM

## 2020-05-01 DIAGNOSIS — R600 Localized edema: Secondary | ICD-10-CM

## 2020-05-01 DIAGNOSIS — M1A9XX0 Chronic gout, unspecified, without tophus (tophi): Secondary | ICD-10-CM

## 2020-05-01 DIAGNOSIS — E785 Hyperlipidemia, unspecified: Secondary | ICD-10-CM

## 2020-05-01 DIAGNOSIS — L03119 Cellulitis of unspecified part of limb: Secondary | ICD-10-CM

## 2020-05-01 DIAGNOSIS — I10 Essential (primary) hypertension: Secondary | ICD-10-CM

## 2020-05-01 LAB — CBC WITH DIFFERENTIAL/PLATELET
Basophils Absolute: 0.1 10*3/uL (ref 0.0–0.1)
Basophils Relative: 0.9 % (ref 0.0–3.0)
Eosinophils Absolute: 0.4 10*3/uL (ref 0.0–0.7)
Eosinophils Relative: 4.1 % (ref 0.0–5.0)
HCT: 40 % (ref 39.0–52.0)
Hemoglobin: 13.4 g/dL (ref 13.0–17.0)
Lymphocytes Relative: 12.4 % (ref 12.0–46.0)
Lymphs Abs: 1.3 10*3/uL (ref 0.7–4.0)
MCHC: 33.6 g/dL (ref 30.0–36.0)
MCV: 89.5 fl (ref 78.0–100.0)
Monocytes Absolute: 0.7 10*3/uL (ref 0.1–1.0)
Monocytes Relative: 6.6 % (ref 3.0–12.0)
Neutro Abs: 7.7 10*3/uL (ref 1.4–7.7)
Neutrophils Relative %: 76 % (ref 43.0–77.0)
Platelets: 295 10*3/uL (ref 150.0–400.0)
RBC: 4.47 Mil/uL (ref 4.22–5.81)
RDW: 15.3 % (ref 11.5–15.5)
WBC: 10.1 10*3/uL (ref 4.0–10.5)

## 2020-05-01 LAB — LIPID PANEL
Cholesterol: 198 mg/dL (ref 0–200)
HDL: 34.8 mg/dL — ABNORMAL LOW (ref >39.00)
NonHDL: 163.28
Total CHOL/HDL Ratio: 6
Triglycerides: 231 mg/dL — ABNORMAL HIGH (ref 0.0–149.0)
VLDL: 46.2 mg/dL — ABNORMAL HIGH (ref 0.0–40.0)

## 2020-05-01 LAB — COMPREHENSIVE METABOLIC PANEL
ALT: 18 U/L (ref 0–53)
AST: 16 U/L (ref 0–37)
Albumin: 4.2 g/dL (ref 3.5–5.2)
Alkaline Phosphatase: 106 U/L (ref 39–117)
BUN: 23 mg/dL (ref 6–23)
CO2: 28 mEq/L (ref 19–32)
Calcium: 9.5 mg/dL (ref 8.4–10.5)
Chloride: 102 mEq/L (ref 96–112)
Creatinine, Ser: 1.7 mg/dL — ABNORMAL HIGH (ref 0.40–1.50)
GFR: 38.96 mL/min — ABNORMAL LOW (ref >60.00)
Glucose, Bld: 88 mg/dL (ref 70–99)
Potassium: 4.8 mEq/L (ref 3.5–5.1)
Sodium: 138 mEq/L (ref 135–145)
Total Bilirubin: 0.8 mg/dL (ref 0.2–1.2)
Total Protein: 7.6 g/dL (ref 6.0–8.3)

## 2020-05-01 LAB — LDL CHOLESTEROL, DIRECT: Direct LDL: 131 mg/dL

## 2020-05-01 LAB — HEMOGLOBIN A1C: Hgb A1c MFr Bld: 5.9 % (ref 4.6–6.5)

## 2020-05-01 MED ORDER — DOXYCYCLINE HYCLATE 100 MG PO TABS: 100.0000 mg | ORAL_TABLET | Freq: Two times a day (BID) | ORAL | 0 refills | Status: DC

## 2020-05-01 MED ORDER — MUPIROCIN 2 % EX OINT: 1.0000 "application " | TOPICAL_OINTMENT | Freq: Two times a day (BID) | CUTANEOUS | 2 refills | Status: DC

## 2020-05-01 MED ORDER — CLOBETASOL PROPIONATE 0.05 % EX CREA: 1.0000 "application " | TOPICAL_CREAM | Freq: Two times a day (BID) | CUTANEOUS | 2 refills | Status: DC

## 2020-05-01 NOTE — Progress Notes (Signed)
Chief Complaint  Patient presents with  . Follow-up    6 mo f/u, having issues w/ gout. Also has rash on both legs around ankles.   6 month f/u  1. Rash to lower legs b/l with itching tried otc topical creams w/o relief and had similar rash in 1995 and it was psoriasis but this seems different 2. H/o left great toe pain c/w gout he takes naproxen prn reviewed with CKD 3 this is not rec and he does not want to try Rx meds he uses sparingly  3. B/l leg swelling we disc. #1 could be due to chronic leg swelling he is taking lasix 20 mg prn due to he feels like when he takes diuretic it can cause gout flare so he takes this prn He declines echo to further w/u  4. HTN elevated on cardura 1 mg qhs, lasix 20 mg prn stopped hctz 25 mg qd, on lis 20 mg bid and lopressor 25 mg bid  Review of Systems  Constitutional: Negative for weight loss.  HENT: Negative for hearing loss.   Eyes: Negative for blurred vision.  Respiratory: Negative for shortness of breath.   Cardiovascular: Positive for leg swelling. Negative for chest pain.  Gastrointestinal: Negative for abdominal pain.  Musculoskeletal: Positive for joint pain.  Skin: Positive for itching and rash.  Neurological: Negative for headaches.  Psychiatric/Behavioral: Negative for depression and memory loss.   Past Medical History:  Diagnosis Date  . CKD (chronic kidney disease), stage III ([place])   . Coronary artery disease    s/p cabg triple bypass in 2011   . Elevated PSA   . Elevated TSH   . Enlarged prostate   . History of influenza   . Hyperlipidemia   . Hypertension    Past Surgical History:  Procedure Laterality Date  . CORONARY ARTERY BYPASS GRAFT     x 3 vessel  . triple bypass  2011   Family History  Problem Relation Age of Onset  . Heart attack Mother   . Heart disease Mother   . Heart disease Father   . Diabetes Sister        type 2   . Kidney disease Sister   . Stroke Maternal Grandmother   . Heart attack Maternal  Grandfather   . Stroke Maternal Grandfather   . Heart disease Maternal Grandfather   . Heart attack Paternal Grandmother   . Heart disease Paternal Grandmother   . Stroke Paternal Grandfather   . Diabetes Son        type 1  . Diabetes Grandchild        type 2   Social History   Socioeconomic History  . Marital status: Married    Spouse name: Not on file  . Number of children: Not on file  . Years of education: Not on file  . Highest education level: Not on file  Occupational History  . Not on file  Tobacco Use  . Smoking status: Former Smoker    Packs/day: 1.50    Years: 30.00    Pack years: 45.00    Types: Cigarettes  . Smokeless tobacco: Never Used  Substance and Sexual Activity  . Alcohol use: No  . Drug use: No  . Sexual activity: Not Currently  Other Topics Concern  . Not on file  Social History Narrative   Married    BS degree chemistry    Kids 33 y.o son    Owns guns, wears seat belt, safe  in relationship    Social Determinants of Health   Financial Resource Strain: Not on file  Food Insecurity: Not on file  Transportation Needs: Not on file  Physical Activity: Not on file  Stress: Not on file  Social Connections: Not on file  Intimate Partner Violence: Not on file   Current Meds  Medication Sig  . aspirin EC 81 MG tablet Take 1 tablet (81 mg total) by mouth daily. Swallow whole.  . clobetasol cream (TEMOVATE) 0.98 % Apply 1 application topically 2 (two) times daily. Lower legs itching and rash  . doxazosin (CARDURA) 1 MG tablet Take 1 tablet (1 mg total) by mouth 2 (two) times daily. Hold for systolic blood pressure less than 120 (Patient taking differently: Take 1 mg by mouth as needed. Hold for systolic blood pressure less than 120)  . doxycycline (VIBRA-TABS) 100 MG tablet Take 1 tablet (100 mg total) by mouth 2 (two) times daily. With food x 7-10 days  . furosemide (LASIX) 20 MG tablet Take 1 tablet (20 mg total) by mouth as needed. Take 20mg  tablet  by mouth daily on the days you do not take HCTZ  . levothyroxine (SYNTHROID) 75 MCG tablet TAKE 1 TABLET BY MOUTH ONCE DAILY BEFORE BREAKFAST  . lisinopril (ZESTRIL) 20 MG tablet Take 1 tablet (20 mg total) by mouth 2 (two) times daily.  . metoprolol tartrate (LOPRESSOR) 25 MG tablet Take 1 tablet (25 mg total) by mouth 2 (two) times daily.  . mupirocin ointment (BACTROBAN) 2 % Apply 1 application topically 2 (two) times daily. Open wounds to legs  . NAPROXEN PO Take by mouth.  . [DISCONTINUED] hydrochlorothiazide (HYDRODIURIL) 25 MG tablet Take 1 tablet (25 mg total) by mouth daily.   Allergies  Allergen Reactions  . Lipitor [Atorvastatin Calcium]     Muscle aches   Recent Results (from the past 2160 hour(s))  Comprehensive metabolic panel     Status: Abnormal   Collection Time: 05/01/20 11:56 AM  Result Value Ref Range   Sodium 138 135 - 145 mEq/L   Potassium 4.8 3.5 - 5.1 mEq/L   Chloride 102 96 - 112 mEq/L   CO2 28 19 - 32 mEq/L   Glucose, Bld 88 70 - 99 mg/dL   BUN 23 6 - 23 mg/dL   Creatinine, Ser 1.70 (H) 0.40 - 1.50 mg/dL   Total Bilirubin 0.8 0.2 - 1.2 mg/dL   Alkaline Phosphatase 106 39 - 117 U/L   AST 16 0 - 37 U/L   ALT 18 0 - 53 U/L   Total Protein 7.6 6.0 - 8.3 g/dL   Albumin 4.2 3.5 - 5.2 g/dL   GFR 38.96 (L) >60.00 mL/min    Comment: Calculated using the CKD-EPI Creatinine Equation (2021)   Calcium 9.5 8.4 - 10.5 mg/dL  Lipid panel     Status: Abnormal   Collection Time: 05/01/20 11:56 AM  Result Value Ref Range   Cholesterol 198 0 - 200 mg/dL    Comment: ATP III Classification       Desirable:  < 200 mg/dL               Borderline High:  200 - 239 mg/dL          High:  > = 240 mg/dL   Triglycerides 231.0 (H) 0.0 - 149.0 mg/dL    Comment: Normal:  <150 mg/dLBorderline High:  150 - 199 mg/dL   HDL 34.80 (L) >39.00 mg/dL   VLDL 46.2 (H) 0.0 -  40.0 mg/dL   Total CHOL/HDL Ratio 6     Comment:                Men          Women1/2 Average Risk     3.4           3.3Average Risk          5.0          4.42X Average Risk          9.6          7.13X Average Risk          15.0          11.0                       NonHDL 163.28     Comment: NOTE:  Non-HDL goal should be 30 mg/dL higher than patient's LDL goal (i.e. LDL goal of < 70 mg/dL, would have non-HDL goal of < 100 mg/dL)  CBC w/Diff     Status: None   Collection Time: 05/01/20 11:56 AM  Result Value Ref Range   WBC 10.1 4.0 - 10.5 K/uL   RBC 4.47 4.22 - 5.81 Mil/uL   Hemoglobin 13.4 13.0 - 17.0 g/dL   HCT 40.0 39.0 - 52.0 %   MCV 89.5 78.0 - 100.0 fl   MCHC 33.6 30.0 - 36.0 g/dL   RDW 15.3 11.5 - 15.5 %   Platelets 295.0 150.0 - 400.0 K/uL   Neutrophils Relative % 76.0 43.0 - 77.0 %   Lymphocytes Relative 12.4 12.0 - 46.0 %   Monocytes Relative 6.6 3.0 - 12.0 %   Eosinophils Relative 4.1 0.0 - 5.0 %   Basophils Relative 0.9 0.0 - 3.0 %   Neutro Abs 7.7 1.4 - 7.7 K/uL   Lymphs Abs 1.3 0.7 - 4.0 K/uL   Monocytes Absolute 0.7 0.1 - 1.0 K/uL   Eosinophils Absolute 0.4 0.0 - 0.7 K/uL   Basophils Absolute 0.1 0.0 - 0.1 K/uL  Hemoglobin A1c     Status: None   Collection Time: 05/01/20 11:56 AM  Result Value Ref Range   Hgb A1c MFr Bld 5.9 4.6 - 6.5 %    Comment: Glycemic Control Guidelines for People with Diabetes:Non Diabetic:  <6%Goal of Therapy: <7%Additional Action Suggested:  >8%   Uric acid     Status: None   Collection Time: 05/01/20 11:56 AM  Result Value Ref Range   Uric Acid 8.3 3.8 - 8.4 mg/dL    Comment:            Therapeutic target for gout patients: <6.0  LDL cholesterol, direct     Status: None   Collection Time: 05/01/20 11:56 AM  Result Value Ref Range   Direct LDL 131.0 mg/dL    Comment: Optimal:  <100 mg/dLNear or Above Optimal:  100-129 mg/dLBorderline High:  130-159 mg/dLHigh:  160-189 mg/dLVery High:  >190 mg/dL   Objective  Body mass index is 39.23 kg/m. Wt Readings from Last 3 Encounters:  05/01/20 273 lb 6 oz (124 kg)  02/15/20 278 lb 4 oz (126.2 kg)  11/02/19  264 lb (119.7 kg)   Temp Readings from Last 3 Encounters:  05/01/20 98.1 F (36.7 C)  04/26/18 98.9 F (37.2 C) (Oral)  01/18/18 98.3 F (36.8 C) (Oral)   BP Readings from Last 3 Encounters:  05/01/20 132/88  02/15/20 (!) 150/80  11/02/19 (!) 156/86  Pulse Readings from Last 3 Encounters:  05/01/20 74  02/15/20 82  11/01/18 81    Physical Exam Vitals and nursing note reviewed.  Constitutional:      Appearance: Normal appearance. He is well-developed and well-groomed. He is obese.  HENT:     Head: Normocephalic and atraumatic.  Cardiovascular:     Rate and Rhythm: Normal rate and regular rhythm.     Heart sounds: Normal heart sounds. No murmur heard.     Comments: Stasis derm b/l legs  Pulmonary:     Effort: Pulmonary effort is normal.     Breath sounds: Normal breath sounds.  Musculoskeletal:     Right lower leg: 2+ Pitting Edema present.     Left lower leg: 2+ Pitting Edema present.  Skin:    General: Skin is warm and dry.  Neurological:     General: No focal deficit present.     Mental Status: He is alert and oriented to person, place, and time. Mental status is at baseline.     Gait: Gait normal.  Psychiatric:        Attention and Perception: Attention and perception normal.        Mood and Affect: Mood and affect normal.        Speech: Speech normal.        Behavior: Behavior normal. Behavior is cooperative.        Thought Content: Thought content normal.        Cognition and Memory: Cognition and memory normal.        Judgment: Judgment normal.     Assessment  Plan  Cellulitis of lower extremity, unspecified laterality - Plan: doxycycline (VIBRA-TABS) 100 MG tablet bid x 7-10 days, mupirocin ointment (BACTROBAN) 2 %  Stasis dermatitis of both legs - Plan: clobetasol cream (TEMOVATE) 0.05 % For arms sarna lotion for itching and amlactin lotion over the counter  Dove antibacterial soap bodywash   Primary hypertension - Plan: Comprehensive metabolic  panel, Lipid panel, CBC w/Diff cardura 1 mg qhs, lasix 20 mg prn stopped hctz 25 mg qd, on lis 20 mg bid and lopressor 25 mg bid Monitor BP  Prediabetes/hld - Plan: Hemoglobin A1c Healthy diet and exercise  Denies statin/non statin medication  Chronic gout involving toe of left foot without tophus, unspecified cause - Plan: Uric acid Given gout info Hold allopurinol due to CKD 3 pt declines as well  Vitamin C good for gout /tart cherry low sugar   Bilateral leg edema  Consider echo heart declines  coppertone compression stockings when legs heal -[employer]   Elevated PSA  -declines MRI prostate consider again addressed   HM Flu shot due had flu in 2006 and was very sick sch rec get Tdap  [employer] last dose 2 or 04/2019 2/2 sch booster dose Will disc shingrix in future Consider prevnar, pna 23 hep C neg  PSA elevatedconsider urology in futuredenied in past declines prostate MRI 11/02/19 he does not want to know if he has prostate cancer and this is why   Ref. Range 11/05/2019 09:11  PSA Latest Ref Range: 0.10 - 4.00 ng/mL 7.74 (H)    Denies colonoscopy -declines cologuard  CT chest consider former smoker [phone] 1-2 ppd no FH lung cancer reviewedto consider prev discussed  Provider: Dr. Olivia Mackie McLean-Scocuzza-Internal Medicine

## 2020-05-01 NOTE — Patient Instructions (Addendum)
For arms sarna lotion for itching and amlactin lotion over the counter  Dove antibacterial soap bodywash  Vitamin C good for gout /tart cherry low sugar  Consider prostate MRI Consider echo of your heart   coppertone compression stockings when legs heal -[facility]   Cellulitis, Adult  Cellulitis is a skin infection. The infected area is usually warm, red, swollen, and tender. This condition occurs most often in the arms and lower legs. The infection can travel to the muscles, blood, and underlying tissue and become serious. It is very important to get treated for this condition. What are the causes? Cellulitis is caused by bacteria. The bacteria enter through a break in the skin, such as a cut, burn, insect bite, open sore, or crack. What increases the risk? This condition is more likely to occur in people who:  Have a weak body defense system (immune system).  Have open wounds on the skin, such as cuts, burns, bites, and scrapes. Bacteria can enter the body through these open wounds.  Are older than 76 years of age.  Have diabetes.  Have a type of long-lasting (chronic) liver disease (cirrhosis) or kidney disease.  Are obese.  Have a skin condition such as: ? Itchy rash (eczema). ? Slow movement of blood in the veins (venous stasis). ? Fluid buildup below the skin (edema).  Have had radiation therapy.  Use IV drugs. What are the signs or symptoms? Symptoms of this condition include:  Redness, streaking, or spotting on the skin.  Swollen area of the skin.  Tenderness or pain when an area of the skin is touched.  Warm skin.  A fever.  Chills.  Blisters. How is this diagnosed? This condition is diagnosed based on a medical history and physical exam. You may also have tests, including:  Blood tests.  Imaging tests. How is this treated? Treatment for this condition may include:  Medicines, such as antibiotic medicines or medicines to treat allergies  (antihistamines).  Supportive care, such as rest and application of cold or warm cloths (compresses) to the skin.  Hospital care, if the condition is severe. The infection usually starts to get better within 1-2 days of treatment. Follow these instructions at home: Medicines  Take over-the-counter and prescription medicines only as told by your health care provider.  If you were prescribed an antibiotic medicine, take it as told by your health care provider. Do not stop taking the antibiotic even if you start to feel better. General instructions  Drink enough fluid to keep your urine pale yellow.  Do not touch or rub the infected area.  Raise (elevate) the infected area above the level of your heart while you are sitting or lying down.  Apply warm or cold compresses to the affected area as told by your health care provider.  Keep all follow-up visits as told by your health care provider. This is important. These visits let your health care provider make sure a more serious infection is not developing.   Contact a health care provider if:  You have a fever.  Your symptoms do not begin to improve within 1-2 days of starting treatment.  Your bone or joint underneath the infected area becomes painful after the skin has healed.  Your infection returns in the same area or another area.  You notice a swollen bump in the infected area.  You develop new symptoms.  You have a general ill feeling (malaise) with muscle aches and pains. Get help right away if:  Your symptoms get worse.  You feel very sleepy.  You develop vomiting or diarrhea that persists.  You notice red streaks coming from the infected area.  Your red area gets larger or turns dark in color. These symptoms may represent a serious problem that is an emergency. Do not wait to see if the symptoms will go away. Get medical help right away. Call your local emergency services (911 in the [place] Do not drive yourself  to the hospital. Summary  Cellulitis is a skin infection. This condition occurs most often in the arms and lower legs.  Treatment for this condition may include medicines, such as antibiotic medicines or antihistamines.  Take over-the-counter and prescription medicines only as told by your health care provider. If you were prescribed an antibiotic medicine, do not stop taking the antibiotic even if you start to feel better.  Contact a health care provider if your symptoms do not begin to improve within 1-2 days of starting treatment or your symptoms get worse.  Keep all follow-up visits as told by your health care provider. This is important. These visits let your health care provider make sure that a more serious infection is not developing. This information is not intended to replace advice given to you by your health care provider. Make sure you discuss any questions you have with your health care provider. Document Revised: 02/05/2019 Document Reviewed: 06/16/2017 Elsevier Patient Education  2021 [place].  Fluid Restriction Fluid restriction means that a person needs to limit the amount of fluid he or she drinks each day due to certain health conditions. The amount of fluid that a person is allowed each day (fluid allowance) may depend on several things, such as:  Kidney function.  How much fluid the body is holding on to (retaining).  Blood pressure.  Heart function.  Blood sodium level. It is important to carefully measure and keep track of the amount of fluid that is consumed each day. What is my plan? Your health care provider recommends that you limit your fluid intake to ____50-55 ounces total__ per day. What counts toward my fluid intake? Your fluid intake includes all liquids that you drink and any foods that become liquid at room temperature. Examples of some fluids that you will have to limit include:  Tea, coffee, soda, lemonade, milk, water, juice, sports  drinks, and nutritional supplement beverages.  Alcoholic beverages.  Cream.  Gravy.  Ice cubes.  Soup and broth. The following are examples of foods that become liquid at room temperature. These foods will also count toward your fluid intake.  Ice cream and ice milk.  Frozen yogurt and sherbet.  Frozen ice pops.  Flavored gelatin. How do I keep track of my fluid intake? Each morning, fill a jug with the amount of water that is equal to your daily fluid allowance. You can use this water as a guideline for fluid allowance. Each time you take in any form of fluid (including ice cubes and foods that become liquid at room temperature), pour an equal amount of water out of the container. This helps you to see how much fluid you are consuming and how much more fluid you can take in during the rest of the day. The following conversions may also be helpful in measuring your fluid intake:  1 cup equals 8 oz (240 mL).   cup equals 6 oz (180 mL).  ? cup equals 5? oz (160 mL).   cup equals 4 oz (120 mL).  ?  cup equals 2? oz (80 mL).   cup equals 2 oz (60 mL).  2 Tbsp equals 1 oz (30 mL). What are tips for following this plan? General instructions  Make sure that you stay within your recommended fluid allowance each day. Always measure and keep track of your fluids, including ice cubes and foods that become liquid at room temperature.  Use small cups and glasses and learn to sip fluids slowly.  Try eating frozen fruits between meals, such as grapes or strawberries. These can satisfy thirst without adding to your fluid intake.  Swallow your pills with meals or soft foods, such as applesauce or mashed potatoes, instead of with liquids. Doing this helps you save your fluid allowance for something that you enjoy. Weigh yourself each day Weigh yourself every day. Keeping track of your daily weight can help you and your health care provider notice as soon as possible if your body is  retaining fluid.  Follow this sequence every morning: 1. Urinate. 2. Weigh yourself. 3. Eat breakfast.  Wear the same amount of clothing each time you weigh yourself.  Write down your daily weight. Give this weight record to your health care provider. If your weight is going up, you may be retaining too much fluid. Every 1 lb (0.45 kg) of body weight that you gain is a sign that your body is retaining 2 cups (480 mL) of fluid.      Manage your thirst  Add lemon juice or a slice of fresh lemon to water or ice. Doing this helps to satisfy your thirst.  Freeze fruit juice or water in an ice cube tray. Use this as part of your fluid allowance. These cubes are useful for quenching your thirst. Before you freeze the juice or water, measure how much liquid you use to fill a cube section of the ice tray. Subtract this amount from your day's allowance each time you consume a frozen cube.  Avoid salty, or high sodium, foods. These foods make you thirsty and make it more difficult to stay within your daily fluid allowance.  Keep the temperature in your home at a cooler level.  Keep the air in your home as humid as possible. Dry air increases thirst.  Avoid being out in the hot sun. This can cause you to sweat and become thirsty.  To help avoid dry mouth, brush your teeth often or rinse out your mouth with mouthwash. Lemon wedges, hard sour candies, chewing gum, or breath spray may also help to moisten your mouth. What are some signs that I may be taking in too much fluid? You may be taking in too much fluid if:  Your weight increases. Contact your health care provider if you gain weight rapidly.  Your face, hands, legs, feet, and abdomen start to swell.  You have trouble breathing. Summary  Fluid restriction means that a person needs to limit the amount of fluid he or she drinks each day due to certain health conditions. The amount of fluid that you are allowed each day may depend on kidney  function and other factors.  It is important to carefully measure and keep track of the amount of fluid that you consume each day.  Your fluid intake includes all liquids that you drink, as well as any foods that become liquid at room temperature, such as ice cream, ice cubes, and gelatin.  You may be taking in too much fluid if your weight increases, your body starts to swell, or  you have trouble breathing. This information is not intended to replace advice given to you by your health care provider. Make sure you discuss any questions you have with your health care provider. Document Revised: 11/13/2019 Document Reviewed: 11/13/2019 Elsevier Patient Education  2021 [place] A low-purine eating plan involves making food choices to limit your intake of purine. Purine is a kind of uric acid. Too much uric acid in your blood can cause certain conditions, such as gout and kidney stones. Eating a low-purine diet can help control these conditions. What are tips for following this plan? Reading food labels  Avoid foods with saturated or Trans fat.  Check the ingredient list of grains-based foods, such as bread and cereal, to make sure that they contain whole grains.  Check the ingredient list of sauces or soups to make sure they do not contain meat or fish.  When choosing soft drinks, check the ingredient list to make sure they do not contain high-fructose corn syrup. Shopping  Buy plenty of fresh fruits and vegetables.  Avoid buying canned or fresh fish.  Buy dairy products labeled as low-fat or nonfat.  Avoid buying premade or processed foods. These foods are often high in fat, salt (sodium), and added sugar.   Cooking  Use olive oil instead of butter when cooking. Oils like olive oil, canola oil, and sunflower oil contain healthy fats. Meal planning  Learn which foods do or do not affect you. If you find out that a food tends to cause your gout  symptoms to flare up, avoid eating that food. You can enjoy foods that do not cause problems. If you have any questions about a food item, talk with your dietitian or health care provider.  Limit foods high in fat, especially saturated fat. Fat makes it harder for your body to get rid of uric acid.  Choose foods that are lower in fat and are lean sources of protein. General guidelines  Limit alcohol intake to no more than 1 drink a day for nonpregnant women and 2 drinks a day for men. One drink equals 12 oz of beer, 5 oz of wine, or 1 oz of hard liquor. Alcohol can affect the way your body gets rid of uric acid.  Drink plenty of water to keep your urine clear or pale yellow. Fluids can help remove uric acid from your body.  If directed by your health care provider, take a vitamin C supplement.  Work with your health care provider and dietitian to develop a plan to achieve or maintain a healthy weight. Losing weight can help reduce uric acid in your blood. What foods are recommended? The items listed may not be a complete list. Talk with your dietitian about what dietary choices are best for you. Foods low in purines Foods low in purines do not need to be limited. These include:  All fruits.  All low-purine vegetables, pickles, and olives.  Breads, pasta, rice, cornbread, and popcorn. Cake and other baked goods.  All dairy foods.  Eggs, nuts, and nut butters.  Spices and condiments, such as salt, herbs, and vinegar.  Plant oils, butter, and margarine.  Water, sugar-free soft drinks, tea, coffee, and cocoa.  Vegetable-based soups, broths, sauces, and gravies. Foods moderate in purines Foods moderate in purines should be limited to the amounts listed.   cup of asparagus, cauliflower, spinach, mushrooms, or green peas, each day.  2/3 cup uncooked oatmeal, each day.  cup dry wheat bran or wheat germ, each day.  2-3 ounces of meat or poultry, each day.  4-6 ounces of  shellfish, such as crab, lobster, oysters, or shrimp, each day.  1 cup cooked beans, peas, or lentils, each day.  Soup, broths, or bouillon made from meat or fish. Limit these foods as much as possible. What foods are not recommended? The items listed may not be a complete list. Talk with your dietitian about what dietary choices are best for you. Limit your intake of foods high in purines, including:  Beer and other alcohol.  Meat-based gravy or sauce.  Canned or fresh fish, such as: ? Anchovies, sardines, herring, and tuna. ? Mussels and scallops. ? Codfish, trout, and haddock.  Berniece Salines.  Organ meats, such as: ? Liver or kidney. ? Tripe. ? Sweetbreads (thymus gland or pancreas).  Wild Clinical biochemist.  Yeast or yeast extract supplements.  Drinks sweetened with high-fructose corn syrup. Summary  Eating a low-purine diet can help control conditions caused by too much uric acid in the body, such as gout or kidney stones.  Choose low-purine foods, limit alcohol, and limit foods high in fat.  You will learn over time which foods do or do not affect you. If you find out that a food tends to cause your gout symptoms to flare up, avoid eating that food. This information is not intended to replace advice given to you by your health care provider. Make sure you discuss any questions you have with your health care provider. Document Revised: 05/10/2019 Document Reviewed: 05/10/2019 Elsevier Patient Education  2021 [place] Eating Plan DASH stands for Dietary Approaches to Stop Hypertension. The DASH eating plan is a healthy eating plan that has been shown to:  Reduce high blood pressure (hypertension).  Reduce your risk for type 2 diabetes, heart disease, and stroke.  Help with weight loss. What are tips for following this plan? Reading food labels  Check food labels for the amount of salt (sodium) per serving. Choose foods with less than 5 percent of the Daily  Value of sodium. Generally, foods with less than 300 milligrams (mg) of sodium per serving fit into this eating plan.  To find whole grains, look for the word "whole" as the first word in the ingredient list. Shopping  Buy products labeled as "low-sodium" or "no salt added."  Buy fresh foods. Avoid canned foods and pre-made or frozen meals. Cooking  Avoid adding salt when cooking. Use salt-free seasonings or herbs instead of table salt or sea salt. Check with your health care provider or pharmacist before using salt substitutes.  Do not fry foods. Cook foods using healthy methods such as baking, boiling, grilling, roasting, and broiling instead.  Cook with heart-healthy oils, such as olive, canola, avocado, soybean, or sunflower oil. Meal planning  Eat a balanced diet that includes: ? 4 or more servings of fruits and 4 or more servings of vegetables each day. Try to fill one-half of your plate with fruits and vegetables. ? 6-8 servings of whole grains each day. ? Less than 6 oz (170 g) of lean meat, poultry, or fish each day. A 3-oz (85-g) serving of meat is about the same size as a deck of cards. One egg equals 1 oz (28 g). ? 2-3 servings of low-fat dairy each day. One serving is 1 cup (237 mL). ? 1 serving of nuts, seeds, or beans 5 times each week. ? 2-3 servings of heart-healthy fats. Healthy  fats called omega-3 fatty acids are found in foods such as walnuts, flaxseeds, fortified milks, and eggs. These fats are also found in cold-water fish, such as sardines, salmon, and mackerel.  Limit how much you eat of: ? Canned or prepackaged foods. ? Food that is high in trans fat, such as some fried foods. ? Food that is high in saturated fat, such as fatty meat. ? Desserts and other sweets, sugary drinks, and other foods with added sugar. ? Full-fat dairy products.  Do not salt foods before eating.  Do not eat more than 4 egg yolks a week.  Try to eat at least 2 vegetarian meals a  week.  Eat more home-cooked food and less restaurant, buffet, and fast food.   Lifestyle  When eating at a restaurant, ask that your food be prepared with less salt or no salt, if possible.  If you drink alcohol: ? Limit how much you use to:  0-1 drink a day for women who are not pregnant.  0-2 drinks a day for men. ? Be aware of how much alcohol is in your drink. In the [place] one drink equals one 12 oz bottle of beer (355 mL), one 5 oz glass of wine (148 mL), or one 1 oz glass of hard liquor (44 mL). General information  Avoid eating more than 2,300 mg of salt a day. If you have hypertension, you may need to reduce your sodium intake to 1,500 mg a day.  Work with your health care provider to maintain a healthy body weight or to lose weight. Ask what an ideal weight is for you.  Get at least 30 minutes of exercise that causes your heart to beat faster (aerobic exercise) most days of the week. Activities may include walking, swimming, or biking.  Work with your health care provider or dietitian to adjust your eating plan to your individual calorie needs. What foods should I eat? Fruits All fresh, dried, or frozen fruit. Canned fruit in natural juice (without added sugar). Vegetables Fresh or frozen vegetables (raw, steamed, roasted, or grilled). Low-sodium or reduced-sodium tomato and vegetable juice. Low-sodium or reduced-sodium tomato sauce and tomato paste. Low-sodium or reduced-sodium canned vegetables. Grains Whole-grain or whole-wheat bread. Whole-grain or whole-wheat pasta. Brown rice. [place]. Bulgur. Whole-grain and low-sodium cereals. Pita bread. Low-fat, low-sodium crackers. Whole-wheat flour tortillas. Meats and other proteins Skinless chicken or [place]. Ground chicken or [place]. Pork with fat trimmed off. Fish and seafood. Egg whites. Dried beans, peas, or lentils. Unsalted nuts, nut butters, and seeds. Unsalted canned beans. Lean cuts of beef with fat trimmed off.  Low-sodium, lean precooked or cured meat, such as sausages or meat loaves. Dairy Low-fat (1%) or fat-free (skim) milk. Reduced-fat, low-fat, or fat-free cheeses. Nonfat, low-sodium ricotta or cottage cheese. Low-fat or nonfat yogurt. Low-fat, low-sodium cheese. Fats and oils Soft margarine without trans fats. Vegetable oil. Reduced-fat, low-fat, or light mayonnaise and salad dressings (reduced-sodium). Canola, safflower, olive, avocado, soybean, and sunflower oils. Avocado. Seasonings and condiments Herbs. Spices. Seasoning mixes without salt. Other foods Unsalted popcorn and pretzels. Fat-free sweets. The items listed above may not be a complete list of foods and beverages you can eat. Contact a dietitian for more information. What foods should I avoid? Fruits Canned fruit in a light or heavy syrup. Fried fruit. Fruit in cream or butter sauce. Vegetables Creamed or fried vegetables. Vegetables in a cheese sauce. Regular canned vegetables (not low-sodium or reduced-sodium). Regular canned tomato sauce and paste (not low-sodium or  reduced-sodium). Regular tomato and vegetable juice (not low-sodium or reduced-sodium). Angie Fava. Olives. Grains Baked goods made with fat, such as croissants, muffins, or some breads. Dry pasta or rice meal packs. Meats and other proteins Fatty cuts of meat. Ribs. Fried meat. Berniece Salines. Bologna, salami, and other precooked or cured meats, such as sausages or meat loaves. Fat from the back of a pig (fatback). Bratwurst. Salted nuts and seeds. Canned beans with added salt. Canned or smoked fish. Whole eggs or egg yolks. Chicken or [place] with skin. Dairy Whole or 2% milk, cream, and half-and-half. Whole or full-fat cream cheese. Whole-fat or sweetened yogurt. Full-fat cheese. Nondairy creamers. Whipped toppings. Processed cheese and cheese spreads. Fats and oils Butter. Stick margarine. Lard. Shortening. Ghee. Bacon fat. Tropical oils, such as coconut, palm kernel, or palm  oil. Seasonings and condiments Onion salt, garlic salt, seasoned salt, table salt, and sea salt. Worcestershire sauce. Tartar sauce. Barbecue sauce. Teriyaki sauce. Soy sauce, including reduced-sodium. Steak sauce. Canned and packaged gravies. Fish sauce. Oyster sauce. Cocktail sauce. Store-bought horseradish. Ketchup. Mustard. Meat flavorings and tenderizers. Bouillon cubes. Hot sauces. Pre-made or packaged marinades. Pre-made or packaged taco seasonings. Relishes. Regular salad dressings. Other foods Salted popcorn and pretzels. The items listed above may not be a complete list of foods and beverages you should avoid. Contact a dietitian for more information. Where to find more information  National Heart, Lung, and Blood Institute: https://wilson-eaton.com/  American Heart Association: www.heart.org  Academy of Nutrition and Dietetics: www.eatright.[place]: www.kidney.org Summary  The DASH eating plan is a healthy eating plan that has been shown to reduce high blood pressure (hypertension). It may also reduce your risk for type 2 diabetes, heart disease, and stroke.  When on the DASH eating plan, aim to eat more fresh fruits and vegetables, whole grains, lean proteins, low-fat dairy, and heart-healthy fats.  With the DASH eating plan, you should limit salt (sodium) intake to 2,300 mg a day. If you have hypertension, you may need to reduce your sodium intake to 1,500 mg a day.  Work with your health care provider or dietitian to adjust your eating plan to your individual calorie needs. This information is not intended to replace advice given to you by your health care provider. Make sure you discuss any questions you have with your health care provider. Document Revised: 12/29/2018 Document Reviewed: 12/29/2018 Elsevier Patient Education  2021 [place].  Stasis Dermatitis Stasis dermatitis is a long-term (chronic) skin condition that happens when veins can no  longer pump blood back to the heart (poor circulation). This condition causes a red or brown scaly rash or sores (ulcers) from the pooling of blood (stasis). This condition usually affects the lower legs. It may affect one leg or both legs. Without treatment, severe stasis dermatitis can lead to other skin conditions and infections. What are the causes? This condition is caused by poor circulation. What increases the risk? You are more likely to develop this condition if:  You are not very active.  You stand for long periods of time.  You have veins that have become enlarged and twisted (varicose veins).  You have leg veins that are not strong enough to send blood back to the heart (venous insufficiency).  You have had a blood clot.  You have been pregnant many times.  You have had vein surgery.  You are obese.  You have heart or kidney failure.  You are 47 years of age or older.  You have  had injuries to your legs in the past. What are the signs or symptoms? Common early symptoms of this condition include:  Itchiness in one or both of your legs.  Swelling in your ankle or leg. This might get better overnight but be worse again during the day.  Skin that looks thin on your ankle and leg.  Red or brown marks that develop slowly.  Skin that is dry, cracked, or easily irritated.  Red, swollen skin that is sore or has a burning feeling.  An achy or heavy feeling after you walk or stand for long periods of time.  Pain. Later and more severe symptoms of this condition include:  Skin that looks shiny.  Small, open sores (ulcers). These are often red or purple and leak fluid.  Skin that feels hard.  Severe itching.  A change in the shape or color of your lower legs.  Severe pain.  Difficulty walking.   How is this diagnosed? This condition may be diagnosed based on:  Your symptoms and medical history.  A physical exam. You may also have tests,  including:  Blood tests.  Imaging tests to check blood flow (Doppler ultrasound).  Allergy tests. You may need to see a health care provider who specializes in skin diseases (dermatologist). How is this treated? This condition may be treated with:  Compression stockings or an elastic wrap to improve circulation.  Medicines, such as: ? Corticosteroid creams and ointments. ? Non-corticosteroid medicines applied to the skin (topical). ? Medicine to reduce swelling in the legs (diuretics). ? Antibiotics. ? Medicine to relieve itching (antihistamines).  A bandage (dressing).  A wrap that contains zinc and gelatin (Unna boot).   Follow these instructions at home: Skin care  Moisturize your skin as told by your health care provider. Do not use moisturizers with fragrance. This can irritate your skin.  Apply a cool, wet cloth (cool compress) to the affected areas.  Do not scratch your skin.  Do not rub your skin dry after a bath or shower. Gently pat your skin dry.  Do not use scented soaps, detergents, or perfumes. Medicines  Take or use over-the-counter and prescription medicines only as told by your health care provider.  If you were prescribed an antibiotic medicine, take or use it as told by your health care provider. Do not stop taking or using the antibiotic even if your condition improves. Activity  Walk as told by your health care provider. Walking increases blood flow.  Do calf and ankle exercises throughout the day as told by your health care provider. This will help increase blood flow.  Raise (elevate) your legs above the level of your heart when you are sitting or lying down. Lifestyle  Work with your health care provider to lose weight, if needed.  Do not cross your legs when you sit.  Do not stand or sit in one position for long periods of time.  Wear comfortable, loose-fitting clothing. Circulation in your legs will be worse if you wear tight pants,  belts, and waistbands.  Do not use any products that contain nicotine or tobacco, such as cigarettes, e-cigarettes, and chewing tobacco. If you need help quitting, ask your health care provider. General instructions  If you were asked to use one of the following to help with your condition, follow instructions from your health care provider on how to: ? Remove and change any dressing. ? Wear compression stockings. These stockings help to prevent blood clots and reduce swelling in  your legs. ? Wear the [employer].  Keep all follow-up visits as told by your health care provider. This is important. Contact a health care provider if:  Your condition does not improve with treatment.  Your condition gets worse.  You have signs of infection in the affected area. Watch for: ? Swelling. ? Tenderness. ? Redness. ? Soreness. ? Warmth.  You have a fever. Get help right away if:  You notice red streaks coming from the affected area.  Your bone or joint underneath the affected area becomes painful after the skin has healed.  The affected area turns darker.  You feel a deep pain in your leg or groin.  You are short of breath. Summary  Stasis dermatitis is a long-term (chronic) skin condition that happens when veins can no longer pump blood back to the heart (poor circulation).  Wear compression stockings as told by your health care provider. These stockings help to prevent blood clots and reduce swelling in your legs.  Follow instructions from your health care provider about activity, medicines, and lifestyle.  Contact a health care provider if you have a fever or have signs of infection in the affected area.  Keep all follow-up visits as told by your health care provider. This is important. This information is not intended to replace advice given to you by your health care provider. Make sure you discuss any questions you have with your health care provider. Document Revised:  06/27/2017 Document Reviewed: 06/27/2017 Elsevier Patient Education  2021 [employer].  Seborrheic Keratosis A seborrheic keratosis is a common, noncancerous (benign) skin growth. These growths are velvety, waxy, rough, tan, brown, or black spots that appear on the skin. These skin growths can be flat or raised, and scaly. What are the causes? The cause of this condition is not known. What increases the risk? You are more likely to develop this condition if you:  Have a family history of seborrheic keratosis.  Are 50 or older.  Are pregnant.  Have had estrogen replacement therapy. What are the signs or symptoms? Symptoms of this condition include growths on the face, chest, shoulders, back, or other areas. These growths:  Are usually painless, but may become irritated and itchy.  Can be yellow, brown, black, or other colors.  Are slightly raised or have a flat surface.  Are sometimes rough or wart-like in texture.  Are often velvety or waxy on the surface.  Are round or oval-shaped.  Often occur in groups, but may occur as a single growth.   How is this diagnosed? This condition is diagnosed with a medical history and physical exam.  A sample of the growth may be tested (skin biopsy).  You may need to see a skin specialist (dermatologist). How is this treated? Treatment is not usually needed for this condition, unless the growths are irritated or bleed often.  You may also choose to have the growths removed if you do not like their appearance. ? Most commonly, these growths are treated with a procedure in which liquid nitrogen is applied to "freeze" off the growth (cryosurgery). ? They may also be burned off with electricity (electrocautery) or removed by scraping (curettage). Follow these instructions at home:  Watch your growth for any changes.  Keep all follow-up visits as told by your health care provider. This is important.  Do not scratch or pick at the  growth or growths. This can cause them to become irritated or infected. Contact a health care provider if:  You suddenly have many new growths.  Your growth bleeds, itches, or hurts.  Your growth suddenly becomes larger or changes color. Summary  A seborrheic keratosis is a common, noncancerous (benign) skin growth.  Treatment is not usually needed for this condition, unless the growths are irritated or bleed often.  Watch your growth for any changes.  Contact a health care provider if you suddenly have many new growths or your growth suddenly becomes larger or changes color.  Keep all follow-up visits as told by your health care provider. This is important. This information is not intended to replace advice given to you by your health care provider. Make sure you discuss any questions you have with your health care provider. Document Revised: 06/09/2017 Document Reviewed: 06/09/2017 Elsevier Patient Education  2021 [employer].

## 2020-05-02 LAB — URIC ACID: Uric Acid: 8.3 mg/dL (ref 3.8–8.4)

## 2020-05-06 NOTE — Progress Notes (Signed)
Called pt. Unable to leave voicemail.

## 2020-05-08 ENCOUNTER — Telehealth: Payer: Self-pay | Admitting: Internal Medicine

## 2020-05-08 DIAGNOSIS — M1A9XX0 Chronic gout, unspecified, without tophus (tophi): Secondary | ICD-10-CM

## 2020-05-08 DIAGNOSIS — I872 Venous insufficiency (chronic) (peripheral): Secondary | ICD-10-CM

## 2020-05-08 HISTORY — DX: Chronic gout, unspecified, without tophus (tophi): M1A.9XX0

## 2020-05-08 HISTORY — DX: Venous insufficiency (chronic) (peripheral): I87.2

## 2020-05-08 NOTE — Telephone Encounter (Signed)
Ok noted  

## 2020-05-08 NOTE — Telephone Encounter (Signed)
Logan Glow McLean-Scocuzza, MD  05/02/2020 12:54 PM EDT      Gout # increased from 7.5 to 8.3  -we could try allopurinol 50 mg daily but would have to monitor his kidney function? Does he want to do this?  Cholesterol uncontrolled  -does he want to try non statin zetia? Or lower intensity cholesterol medication?   He has prediabetes but its improved if were 5.6 no prediabetes  Kidney function about the same chronic kidney disease stage 3  -does he want to see a kidney specialist?   Blood cts normal

## 2020-05-08 NOTE — Telephone Encounter (Signed)
Patient informed and verbalized understanding.  Patient declines all medications and referrals. Informed the Patient how the Zetia is a non-statin and can help reduce complications caused by high cholesterol. Patient still declines.   Patient states medication he was given last week is helping a lot. States that his ankles are clearing up.   For your information

## 2020-07-21 ENCOUNTER — Other Ambulatory Visit: Payer: Self-pay | Admitting: Cardiovascular Disease

## 2020-07-21 ENCOUNTER — Other Ambulatory Visit: Payer: Self-pay | Admitting: Internal Medicine

## 2020-07-21 DIAGNOSIS — I10 Essential (primary) hypertension: Secondary | ICD-10-CM

## 2020-07-21 DIAGNOSIS — E039 Hypothyroidism, unspecified: Secondary | ICD-10-CM

## 2020-07-22 ENCOUNTER — Telehealth: Payer: Self-pay | Admitting: Internal Medicine

## 2020-07-22 ENCOUNTER — Telehealth: Payer: Self-pay | Admitting: Cardiovascular Disease

## 2020-07-22 ENCOUNTER — Other Ambulatory Visit: Payer: Self-pay | Admitting: Internal Medicine

## 2020-07-22 DIAGNOSIS — I10 Essential (primary) hypertension: Secondary | ICD-10-CM

## 2020-07-22 DIAGNOSIS — M109 Gout, unspecified: Secondary | ICD-10-CM

## 2020-07-22 MED ORDER — ALLOPURINOL 100 MG PO TABS
50.0000 mg | ORAL_TABLET | Freq: Every day | ORAL | 3 refills | Status: DC
Start: 2020-07-22 — End: 2020-09-15

## 2020-07-22 MED ORDER — LISINOPRIL 20 MG PO TABS: 1.0000 | ORAL_TABLET | Freq: Two times a day (BID) | ORAL | 0 refills | Status: DC

## 2020-07-22 MED ORDER — METOPROLOL TARTRATE 25 MG PO TABS: 25.0000 mg | ORAL_TABLET | Freq: Two times a day (BID) | ORAL | 0 refills | Status: DC

## 2020-07-22 NOTE — Telephone Encounter (Signed)
Patient saw Dr Aundra Dubin on 05/08/2020. She recommended a gout medication to try. He would like to try that medication, and is requesting a prescription.

## 2020-07-22 NOTE — Telephone Encounter (Signed)
Please advise 

## 2020-07-22 NOTE — Telephone Encounter (Signed)
Sent allopurinol 50 mg daily will need to have f/u in 2-3 months for  bmet  and  uric acid please order this   thank you

## 2020-07-22 NOTE — Telephone Encounter (Signed)
Requested Prescriptions   Signed Prescriptions Disp Refills   lisinopril (ZESTRIL) 20 MG tablet 180 tablet 0    Sig: Take 1 tablet (20 mg total) by mouth 2 (two) times daily.    Authorizing Provider: Minna Merritts    Ordering User: Othelia Pulling C   metoprolol tartrate (LOPRESSOR) 25 MG tablet 180 tablet 0    Sig: Take 1 tablet (25 mg total) by mouth 2 (two) times daily.    Authorizing Provider: Minna Merritts    Ordering User: Britt Bottom

## 2020-07-22 NOTE — Telephone Encounter (Signed)
*  STAT* If patient is at the pharmacy, call can be transferred to refill team.   1. Which medications need to be refilled? (please list name of each medication and dose if known) lisinopril and metoprolol   2. Which pharmacy/location (including street and city if local pharmacy) is medication to be sent to? [facility]  3. Do they need a 30 day or 90 day supply? 90  Scheduled for 7/8

## 2020-07-23 NOTE — Telephone Encounter (Signed)
No answer, no voicemail.

## 2020-07-29 NOTE — Telephone Encounter (Signed)
Patient scheduled to be seen 09/10/20

## 2020-08-13 NOTE — Progress Notes (Signed)
Date:  08/15/2020   ID:  Logan [name], DOB [birth year], MRN [id number]  Patient Location:  [place] [address]   Provider location:   [facility], [place] office  PCP:  McLean-Scocuzza, Nino Glow, MD  Cardiologist:  Patsy Baltimore   Chief Complaint  Patient presents with   6 month follow up     "Doing well." Medications reviewed by the patient verbally.      History of Present Illness:    Logan [name] is a 75 y.o. male  past medical history of chest pain, 10/2009 cath showing severe coronary artery disease including left main disease, normal ejection fraction,   CABG on 11/07/09 (left internal mammary artery to distal left anterior descending coronary artery, saphenous vein graft to first obtuse marginal branch of the left circumflex coronary artery, saphenous vein graft to distal right coronary artery)  morbid obesity, Chronic diastolic CHF Previous smoker,quit 2011 medication noncompliance Chronic renal sufficiency who presents for follow-up of his coronary artery disease, CABG  LOV with myself July 2019 Seen by one of out providers 02/2020  BP elevated today Not taking cardura (unclear reasons) Not taking lasix (only as needed. "Did research, causes gout") "Sometimes BP good at home"  Does not get out much anyway No regular exercise program,  Weight continues to run high, poor diet  Review of prior notes, previously stopped chlorthalidone on his own Was taking HCTZ on and off, was not taking metoprolol in the morning, rarely was taking Lasix Did not want cholesterol medication Felt aspirin was causing dreams and anxiety  EKG personally reviewed by myself on todays visit Nsr rate 75 nonspecific T wave ABN   Past Medical History:  Diagnosis Date   CKD (chronic kidney disease), stage III ([place])    Coronary artery disease    s/p cabg triple bypass in 2011    Elevated PSA    Elevated TSH    Enlarged prostate     History of influenza    Hyperlipidemia    Hypertension    Past Surgical History:  Procedure Laterality Date   CORONARY ARTERY BYPASS GRAFT     x 3 vessel   triple bypass  2011     Allergies:   Lipitor [atorvastatin calcium]   Social History   Tobacco Use   Smoking status: Former    Packs/day: 1.50    Years: 30.00    Pack years: 45.00    Types: Cigarettes   Smokeless tobacco: Never  Vaping Use   Vaping Use: Never used  Substance Use Topics   Alcohol use: No   Drug use: No     Current Outpatient Medications on File Prior to Visit  Medication Sig Dispense Refill   allopurinol (ZYLOPRIM) 100 MG tablet Take 0.5 tablets (50 mg total) by mouth daily. 90 tablet 3   doxazosin (CARDURA) 1 MG tablet Take 1 tablet (1 mg total) by mouth 2 (two) times daily. Hold for systolic blood pressure less than 120 60 tablet 6   EUTHYROX 75 MCG tablet TAKE 1 TABLET BY MOUTH ONCE DAILY BEFORE BREAKFAST 90 tablet 0   hydrochlorothiazide (HYDRODIURIL) 25 MG tablet Take 25 mg by mouth daily.     lisinopril (ZESTRIL) 20 MG tablet Take 1 tablet (20 mg total) by mouth 2 (two) times daily. 180 tablet 0   metoprolol tartrate (LOPRESSOR) 25 MG tablet Take 1 tablet (25 mg total) by mouth 2 (two) times daily.  180 tablet 0   mupirocin ointment (BACTROBAN) 2 % Apply 1 application topically 2 (two) times daily. Open wounds to legs 30 g 2   NAPROXEN PO Take by mouth.     clobetasol cream (TEMOVATE) 2.53 % Apply 1 application topically 2 (two) times daily. Lower legs itching and rash (Patient not taking: Reported on 08/15/2020) 60 g 2   furosemide (LASIX) 20 MG tablet Take 20 mg by mouth daily as needed. Take 20 mg tablet by mouth daily on the days you do not take HCTZ. (Patient not taking: Reported on 08/15/2020)     No current facility-administered medications on file prior to visit.     Family Hx: The patient's family history includes Diabetes in his grandchild, sister, and son; Heart attack in his maternal  grandfather, mother, and paternal grandmother; Heart disease in his father, maternal grandfather, mother, and paternal grandmother; Kidney disease in his sister; Stroke in his maternal grandfather, maternal grandmother, and paternal grandfather.  ROS:   Please see the history of present illness.    Review of Systems  Constitutional: Negative.   HENT: Negative.    Respiratory: Negative.    Cardiovascular: Negative.   Gastrointestinal: Negative.   Musculoskeletal: Negative.   Neurological: Negative.   Psychiatric/Behavioral: Negative.    All other systems reviewed and are negative.   Labs/Other Tests and Data Reviewed:    Recent Labs: 11/05/2019: TSH 4.16 05/01/2020: ALT 18; BUN 23; Creatinine, Ser 1.70; Hemoglobin 13.4; Platelets 295.0; Potassium 4.8; Sodium 138   Recent Lipid Panel Lab Results  Component Value Date/Time   CHOL 198 05/01/2020 11:56 AM   TRIG 231.0 (H) 05/01/2020 11:56 AM   HDL 34.80 (L) 05/01/2020 11:56 AM   CHOLHDL 6 05/01/2020 11:56 AM   LDLCALC 127 (H) 04/14/2010 09:50 PM   LDLDIRECT 131.0 05/01/2020 11:56 AM    Wt Readings from Last 3 Encounters:  08/15/20 270 lb 8 oz (122.7 kg)  05/01/20 273 lb 6 oz (124 kg)  02/15/20 278 lb 4 oz (126.2 kg)     Exam:    Vital Signs: Vital signs may also be detailed in the HPI BP (!) 160/100 (BP Location: Left Arm, Patient Position: Sitting, Cuff Size: Large)   Pulse 75   Ht 5\' 10"  (1.778 m)   Wt 270 lb 8 oz (122.7 kg)   SpO2 98%   BMI 38.81 kg/m   Constitutional:  oriented to person, place, and time. No distress.  HENT:  Head: Grossly normal Eyes:  no discharge. No scleral icterus.  Neck: No JVD, no carotid bruits  Cardiovascular: Regular rate and rhythm, no murmurs appreciated Pulmonary/Chest: Clear to auscultation bilaterally, no wheezes or rails Abdominal: Soft.  no distension.  no tenderness.  Musculoskeletal: Normal range of motion Neurological:  normal muscle tone. Coordination normal. No  atrophy Skin: Skin warm and dry Psychiatric: normal affect, pleasant   ASSESSMENT & PLAN:    Problem List Items Addressed This Visit       Cardiology Problems   Essential hypertension   Relevant Medications   hydrochlorothiazide (HYDRODIURIL) 25 MG tablet   furosemide (LASIX) 20 MG tablet     Other   TOBACCO ABUSE   S/P CABG (coronary artery bypass graft)   Morbid obesity ([place])   Other Visit Diagnoses     Coronary artery disease of native artery of native heart with stable angina pectoris (HCC)    -  Primary   Relevant Medications   hydrochlorothiazide (HYDRODIURIL) 25 MG tablet   furosemide (  LASIX) 20 MG tablet   Other Relevant Orders   EKG 12-Lead   Chronic heart failure with preserved ejection fraction (HCC)       Relevant Medications   hydrochlorothiazide (HYDRODIURIL) 25 MG tablet   furosemide (LASIX) 20 MG tablet   Other Relevant Orders   EKG 12-Lead   Hyperlipidemia LDL goal <70       Relevant Medications   hydrochlorothiazide (HYDRODIURIL) 25 MG tablet   furosemide (LASIX) 20 MG tablet     Atherosclerosis of native coronary artery of native heart without angina pectoris  Currently with no symptoms of angina. No further workup at this time.  Does not want statin or asa No testing ordered  Essential hypertension, benign -  Very selective about his medications and history of noncompliance We will avoid calcium channel blockers secondary to leg swelling -selective with his metoprolol sometimes takes it in the evening but not in the morning Takes HCTZ but not on a regular basis  Only takes Lasix periodically Not taking cardura ("BP is fine") Recommended better blood pressure control Recommend he take his medications as prescribed   Morbid obesity due to excess calories ([place]) We have encouraged continued exercise, careful diet management in an effort to lose weight.   TOBACCO ABUSE Quit smoking   Hyperlipidemia he does not want a cholesterol  medication Very selective with his medications Poor diet   Leg edema Stable , exacerbated by obesity  diastolic CHF Periodically takes HCTZ  Not on lasix    Total encounter time more than 25 minutes  Greater than 50% was spent in counseling and coordination of care with the patient   Signed, Ida Rogue, [place] [facility] [id number], [place], [place] [address]

## 2020-08-15 ENCOUNTER — Other Ambulatory Visit: Payer: Self-pay

## 2020-08-15 ENCOUNTER — Encounter: Payer: Self-pay | Admitting: Cardiovascular Disease

## 2020-08-15 ENCOUNTER — Ambulatory Visit: Payer: Medicare PPO | Admitting: Cardiovascular Disease

## 2020-08-15 VITALS — BP 160/100 | HR 75 | Ht 70.0 in | Wt 270.5 lb

## 2020-08-15 DIAGNOSIS — Z951 Presence of aortocoronary bypass graft: Secondary | ICD-10-CM

## 2020-08-15 DIAGNOSIS — I5032 Chronic diastolic (congestive) heart failure: Secondary | ICD-10-CM

## 2020-08-15 DIAGNOSIS — F17200 Nicotine dependence, unspecified, uncomplicated: Secondary | ICD-10-CM

## 2020-08-15 DIAGNOSIS — E785 Hyperlipidemia, unspecified: Secondary | ICD-10-CM

## 2020-08-15 DIAGNOSIS — I10 Essential (primary) hypertension: Secondary | ICD-10-CM | POA: Diagnosis not present

## 2020-08-15 DIAGNOSIS — I25118 Atherosclerotic heart disease of native coronary artery with other forms of angina pectoris: Secondary | ICD-10-CM

## 2020-08-15 NOTE — Patient Instructions (Addendum)
Medication Instructions:  RESTART Cardura 1 mg twice a day for pressure >140/90  If you need a refill on your cardiac medications before your next appointment, please call your pharmacy.   Lab work: No new labs needed  Testing/Procedures: No new testing needed   Follow-Up: At [facility], you and your health needs are our priority.  As part of our continuing mission to provide you with exceptional heart care, we have created designated Provider Care Teams.  These Care Teams include your primary Cardiologist (physician) and Advanced Practice Providers (APPs -  Physician Assistants and Nurse Practitioners) who all work together to provide you with the care you need, when you need it.  You will need a follow up appointment in 12 months  Providers on your designated Care Team:   Murray Hodgkins, NP Christell Faith, PA-C Marrianne Mood, PA-C Cadence Linville, [place]  COVID-19 Vaccine Information can be found at: [email or web address] For questions related to vaccine distribution or appointments, please email [email or web address] or call [phone].

## 2020-09-10 ENCOUNTER — Ambulatory Visit: Payer: Medicare PPO | Admitting: Internal Medicine

## 2020-09-10 ENCOUNTER — Other Ambulatory Visit: Payer: Self-pay | Admitting: Nurse Practitioner

## 2020-09-10 ENCOUNTER — Encounter: Payer: Self-pay | Admitting: Internal Medicine

## 2020-09-10 ENCOUNTER — Other Ambulatory Visit: Payer: Self-pay

## 2020-09-10 VITALS — BP 140/86 | HR 78 | Temp 96.0°F | Ht 70.0 in | Wt 267.4 lb

## 2020-09-10 DIAGNOSIS — Z1283 Encounter for screening for malignant neoplasm of skin: Secondary | ICD-10-CM

## 2020-09-10 DIAGNOSIS — R972 Elevated prostate specific antigen [PSA]: Secondary | ICD-10-CM

## 2020-09-10 DIAGNOSIS — L989 Disorder of the skin and subcutaneous tissue, unspecified: Secondary | ICD-10-CM | POA: Diagnosis not present

## 2020-09-10 DIAGNOSIS — E039 Hypothyroidism, unspecified: Secondary | ICD-10-CM | POA: Diagnosis not present

## 2020-09-10 DIAGNOSIS — M109 Gout, unspecified: Secondary | ICD-10-CM

## 2020-09-10 DIAGNOSIS — I10 Essential (primary) hypertension: Secondary | ICD-10-CM | POA: Diagnosis not present

## 2020-09-10 DIAGNOSIS — R7303 Prediabetes: Secondary | ICD-10-CM

## 2020-09-10 DIAGNOSIS — N1832 Chronic kidney disease, stage 3b: Secondary | ICD-10-CM | POA: Diagnosis not present

## 2020-09-10 LAB — COMPREHENSIVE METABOLIC PANEL
ALT: 19 U/L (ref 0–53)
AST: 18 U/L (ref 0–37)
Albumin: 4.1 g/dL (ref 3.5–5.2)
Alkaline Phosphatase: 105 U/L (ref 39–117)
BUN: 26 mg/dL — ABNORMAL HIGH (ref 6–23)
CO2: 27 mEq/L (ref 19–32)
Calcium: 10 mg/dL (ref 8.4–10.5)
Chloride: 100 mEq/L (ref 96–112)
Creatinine, Ser: 1.87 mg/dL — ABNORMAL HIGH (ref 0.40–1.50)
GFR: 34.66 mL/min — ABNORMAL LOW (ref >60.00)
Glucose, Bld: 88 mg/dL (ref 70–99)
Potassium: 4.5 mEq/L (ref 3.5–5.1)
Sodium: 138 mEq/L (ref 135–145)
Total Bilirubin: 0.9 mg/dL (ref 0.2–1.2)
Total Protein: 7.7 g/dL (ref 6.0–8.3)

## 2020-09-10 LAB — LIPID PANEL
Cholesterol: 194 mg/dL (ref 0–200)
HDL: 33.7 mg/dL — ABNORMAL LOW (ref >39.00)
NonHDL: 160.47
Total CHOL/HDL Ratio: 6
Triglycerides: 274 mg/dL — ABNORMAL HIGH (ref 0.0–149.0)
VLDL: 54.8 mg/dL — ABNORMAL HIGH (ref 0.0–40.0)

## 2020-09-10 LAB — CBC WITH DIFFERENTIAL/PLATELET
Basophils Absolute: 0.1 10*3/uL (ref 0.0–0.1)
Basophils Relative: 0.9 % (ref 0.0–3.0)
Eosinophils Absolute: 0.3 10*3/uL (ref 0.0–0.7)
Eosinophils Relative: 3 % (ref 0.0–5.0)
HCT: 40.7 % (ref 39.0–52.0)
Hemoglobin: 13.6 g/dL (ref 13.0–17.0)
Lymphocytes Relative: 15.9 % (ref 12.0–46.0)
Lymphs Abs: 1.4 10*3/uL (ref 0.7–4.0)
MCHC: 33.4 g/dL (ref 30.0–36.0)
MCV: 88.5 fl (ref 78.0–100.0)
Monocytes Absolute: 0.6 10*3/uL (ref 0.1–1.0)
Monocytes Relative: 6.6 % (ref 3.0–12.0)
Neutro Abs: 6.7 10*3/uL (ref 1.4–7.7)
Neutrophils Relative %: 73.6 % (ref 43.0–77.0)
Platelets: 312 10*3/uL (ref 150.0–400.0)
RBC: 4.6 Mil/uL (ref 4.22–5.81)
RDW: 15.2 % (ref 11.5–15.5)
WBC: 9.1 10*3/uL (ref 4.0–10.5)

## 2020-09-10 LAB — HEMOGLOBIN A1C: Hgb A1c MFr Bld: 5.9 % (ref 4.6–6.5)

## 2020-09-10 LAB — URIC ACID: Uric Acid, Serum: 7.8 mg/dL (ref 4.0–7.8)

## 2020-09-10 LAB — TSH: TSH: 2.65 u[IU]/mL (ref 0.35–5.50)

## 2020-09-10 LAB — LDL CHOLESTEROL, DIRECT: Direct LDL: 121 mg/dL

## 2020-09-10 MED ORDER — DOXAZOSIN MESYLATE 1 MG PO TABS: 1.0000 mg | ORAL_TABLET | Freq: Every day | ORAL | 6 refills | Status: DC

## 2020-09-10 MED ORDER — LEVOTHYROXINE SODIUM 75 MCG PO TABS: 75.0000 ug | ORAL_TABLET | Freq: Every day | ORAL | 3 refills | Status: DC

## 2020-09-10 MED ORDER — LISINOPRIL 20 MG PO TABS: 20.0000 mg | ORAL_TABLET | Freq: Two times a day (BID) | ORAL | 3 refills | Status: DC

## 2020-09-10 MED ORDER — METOPROLOL TARTRATE 25 MG PO TABS: 25.0000 mg | ORAL_TABLET | Freq: Two times a day (BID) | ORAL | 3 refills | Status: DC

## 2020-09-10 NOTE — Patient Instructions (Addendum)
Debrox ear wax drops  X 1 week and ear flush with hydrogen peroxide and warm water day 8   Dermatology [address]  [phone]   Consider 4th covid pfizer dose   Pneumococcal Polysaccharide Vaccine (PPSV23): What You Need to Know 1. Why get vaccinated? Pneumococcal polysaccharide vaccine (PPSV23) can prevent pneumococcal disease. Pneumococcal disease refers to any illness caused by pneumococcal bacteria. These bacteria can cause many types of illnesses, including pneumonia, which is an infection ofthe lungs. Pneumococcal bacteria are one of the most common causes of pneumonia. Besides pneumonia, pneumococcal bacteria can also cause: Ear infections Sinus infections Meningitis (infection of the tissue covering the brain and spinal cord) Bacteremia (bloodstream infection) Anyone can get pneumococcal disease, but children under 83 years of age, people with certain medical conditions, adults 1 years or older, and cigarettesmokers are at the highest risk. Most pneumococcal infections are mild. However, some can result in long-term problems, such as brain damage or hearing loss. Meningitis, bacteremia, andpneumonia caused by pneumococcal disease can be fatal. 2. PPSV23 PPSV23 protects against 23 types of bacteria that cause pneumococcal disease. PPSV23 is recommended for: All adults 11 years or older, Anyone 2 years or older with certain medical conditions that can lead to an increased risk for pneumococcal disease. Most people need only one dose of PPSV23. A second dose of PPSV23, and another type of pneumococcal vaccine called PCV13, are recommended for certainhigh-risk groups. Your health care provider can give you more information. People 65 years or older should get a dose of PPSV23 even if they have alreadygotten one or more doses of the vaccine before they turned 53. 3. Talk with your health care provider Tell your vaccine provider if the person getting the vaccine: Has had an  allergic reaction after a previous dose of PPSV23, or has any severe, life-threatening allergies. In some cases, your health care provider may decide to postpone PPSV23vaccination to a future visit. People with minor illnesses, such as a cold, may be vaccinated. People who are moderately or severely ill should usually wait until they recover beforegetting PPSV23. Your health care provider can give you more information. 4. Risks of a vaccine reaction Redness or pain where the shot is given, feeling tired, fever, or muscle aches can happen after PPSV23. People sometimes faint after medical procedures, including vaccination. Tellyour provider if you feel dizzy or have vision changes or ringing in the ears. As with any medicine, there is a very remote chance of a vaccine causing asevere allergic reaction, other serious injury, or death. 5. What if there is a serious problem? An allergic reaction could occur after the vaccinated person leaves the clinic. If you see signs of a severe allergic reaction (hives, swelling of the face and throat, difficulty breathing, a fast heartbeat, dizziness, or weakness), call 9-1-1 and get the person to the nearest hospital. For other signs that concern you, call your health care provider. Adverse reactions should be reported to the Vaccine Adverse Event Reporting System (VAERS). Your health care provider will usually file this report, or you can do it yourself. Visit the VAERS website at www.vaers.SamedayNews.es or call 4248261006. VAERS is only for reporting reactions, and VAERS staff do not give medical advice. 6. How can I learn more? Ask your health care provider. Call your local or state health department. Contact the Centers for Disease Control and Prevention (CDC): Call 407-591-4278 (1-800-CDC-INFO) or Visit CDC's website at http://hunter.com/ Vaccine Information Statement PPSV23 Vaccine (12/07/2017) This information is  not intended to replace advice given to  you by your health care provider. Make sure you discuss any questions you have with your healthcare provider. Document Revised: 09/28/2019 Document Reviewed: 09/28/2019 Elsevier Patient Education  [employer] [name].  Pneumococcal Conjugate Vaccine (Prevnar 13) Suspension for Injection What is this medication? PNEUMOCOCCAL VACCINE (NEU mo KOK al vak SEEN) is a vaccine used to prevent pneumococcus bacterial infections. These bacteria can cause serious infections like pneumonia, meningitis, and blood infections. This vaccine will lower your chance of getting pneumonia. If you do get pneumonia, it can make your symptoms milder and your illness shorter. This vaccine will not treat an infection and will not cause infection. This vaccine is recommended for infants and youngchildren, adults with certain medical conditions, and adults 68 years or older. This medicine may be used for other purposes; ask your health care provider orpharmacist if you have questions. COMMON BRAND NAME(S): Prevnar, Prevnar 13 What should I tell my care team before I take this medication? They need to know if you have any of these conditions: bleeding problems fever immune system problems an unusual or allergic reaction to pneumococcal vaccine, diphtheria toxoid, other vaccines, latex, other medicines, foods, dyes, or preservatives pregnant or trying to get pregnant breast-feeding How should I use this medication? This vaccine is for injection into a muscle. It is given by a health careprofessional. A copy of Vaccine Information Statements will be given before each vaccination.Read this sheet carefully each time. The sheet may change frequently. Talk to your pediatrician regarding the use of this medicine in children. While this drug may be prescribed for children as young as 15 weeks old for selectedconditions, precautions do apply. Overdosage: If you think you have taken too much of this medicine contact apoison control  center or emergency room at once. NOTE: This medicine is only for you. Do not share this medicine with others. What if I miss a dose? It is important not to miss your dose. Call your doctor or health careprofessional if you are unable to keep an appointment. What may interact with this medication? medicines for cancer chemotherapy medicines that suppress your immune function steroid medicines like prednisone or cortisone This list may not describe all possible interactions. Give your health care provider a list of all the medicines, herbs, non-prescription drugs, or dietary supplements you use. Also tell them if you smoke, drink alcohol, or use illegaldrugs. Some items may interact with your medicine. What should I watch for while using this medication? Mild fever and pain should go away in 3 days or less. Report any unusualsymptoms to your doctor or health care professional. What side effects may I notice from receiving this medication? Side effects that you should report to your doctor or health care professionalas soon as possible: allergic reactions like skin rash, itching or hives, swelling of the face, lips, or tongue breathing problems confused fast or irregular heartbeat fever over 102 degrees F seizures unusual bleeding or bruising unusual muscle weakness Side effects that usually do not require medical attention (report to yourdoctor or health care professional if they continue or are bothersome): aches and pains diarrhea fever of 102 degrees F or less headache irritable loss of appetite pain, tender at site where injected trouble sleeping This list may not describe all possible side effects. Call your doctor for medical advice about side effects. You may report side effects to FDA at1-800-FDA-1088. Where should I keep my medication? This does not apply. This vaccine is given in a  clinic, pharmacy, doctor'soffice, or other health care setting and will not be stored at  home. NOTE: This sheet is a summary. It may not cover all possible information. If you have questions about this medicine, talk to your doctor, pharmacist, orhealth care provider.  2022 Elsevier/Gold Standard (2013-11-01 10:27:27)  Gout  Gout is a condition that causes painful swelling of the joints. Gout is a type of inflammation of the joints (arthritis). This condition is caused by having too much uric acid in the body. Uric acid is a chemical that forms when the body breaks down substances called purines.Purines are important for building body proteins. When the body has too much uric acid, sharp crystals can form and build up inside the joints. This causes pain and swelling. Gout attacks can happen quickly and may be very painful (acute gout). Over time, the attacks can affect more joints and become more frequent (chronic gout). Gout can also cause uric acid to build up under the skin and inside thekidneys. What are the causes? This condition is caused by too much uric acid in your blood. This can happen because: Your kidneys do not remove enough uric acid from your blood. This is the most common cause. Your body makes too much uric acid. This can happen with some cancers and cancer treatments. It can also occur if your body is breaking down too many red blood cells (hemolytic anemia). You eat too many foods that are high in purines. These foods include organ meats and some seafood. Alcohol, especially beer, is also high in purines. A gout attack may be triggered by trauma or stress. What increases the risk? You are more likely to develop this condition if you: Have a family history of gout. Are male and middle-aged. Are male and have gone through menopause. Are obese. Frequently drink alcohol, especially beer. Are dehydrated. Lose weight too quickly. Have an organ transplant. Have lead poisoning. Take certain medicines, including aspirin, cyclosporine, diuretics, levodopa, and  niacin. Have kidney disease. Have a skin condition called psoriasis. What are the signs or symptoms? An attack of acute gout happens quickly. It usually occurs in just one joint. The most common place is the big toe. Attacks often start at night. Other joints that may be affected include joints of the feet, ankle, knee, fingers, wrist, or elbow. Symptoms of this condition may include: Severe pain. Warmth. Swelling. Stiffness. Tenderness. The affected joint may be very painful to touch. Shiny, red, or purple skin. Chills and fever. Chronic gout may cause symptoms more frequently. More joints may be involved. You may also have white or yellow lumps (tophi) on your hands or feet or in other areas near your joints. How is this diagnosed? This condition is diagnosed based on your symptoms, medical history, and physical exam. You may have tests, such as: Blood tests to measure uric acid levels. Removal of joint fluid with a thin needle (aspiration) to look for uric acid crystals. X-rays to look for joint damage. How is this treated? Treatment for this condition has two phases: treating an acute attack and preventing future attacks. Acute gout treatment may include medicines to reduce pain and swelling, including: NSAIDs. Steroids. These are strong anti-inflammatory medicines that can be taken by mouth (orally) or injected into a joint. Colchicine. This medicine relieves pain and swelling when it is taken soon after an attack. It can be given by mouth or through an IV. Preventive treatment may include: Daily use of smaller doses of NSAIDs or colchicine.  Use of a medicine that reduces uric acid levels in your blood. Changes to your diet. You may need to see a dietitian about what to eat and drink to prevent gout. Follow these instructions at home: During a gout attack  If directed, put ice on the affected area: Put ice in a plastic bag. Place a towel between your skin and the bag. Leave the  ice on for 20 minutes, 2-3 times a day. Raise (elevate) the affected joint above the level of your heart as often as possible. Rest the joint as much as possible. If the affected joint is in your leg, you may be given crutches to use. Follow instructions from your health care provider about eating or drinking restrictions.  Avoiding future gout attacks Follow a low-purine diet as told by your dietitian or health care provider. Avoid foods and drinks that are high in purines, including liver, kidney, anchovies, asparagus, herring, mushrooms, mussels, and beer. Maintain a healthy weight or lose weight if you are overweight. If you want to lose weight, talk with your health care provider. It is important that you do not lose weight too quickly. Start or maintain an exercise program as told by your health care provider. Eating and drinking Drink enough fluids to keep your urine pale yellow. If you drink alcohol: Limit how much you use to: 0-1 drink a day for women. 0-2 drinks a day for men. Be aware of how much alcohol is in your drink. In the [place] one drink equals one 12 oz bottle of beer (355 mL) one 5 oz glass of wine (148 mL), or one 1 oz glass of hard liquor (44 mL). General instructions Take over-the-counter and prescription medicines only as told by your health care provider. Do not drive or use heavy machinery while taking prescription pain medicine. Return to your normal activities as told by your health care provider. Ask your health care provider what activities are safe for you. Keep all follow-up visits as told by your health care provider. This is important. Contact a health care provider if you have: Another gout attack. Continuing symptoms of a gout attack after 10 days of treatment. Side effects from your medicines. Chills or a fever. Burning pain when you urinate. Pain in your lower back or belly. Get help right away if you: Have severe or uncontrolled pain. Cannot  urinate. Summary Gout is painful swelling of the joints caused by inflammation. The most common site of pain is the big toe, but it can affect other joints in the body. Medicines and dietary changes can help to prevent and treat gout attacks. This information is not intended to replace advice given to you by your health care provider. Make sure you discuss any questions you have with your healthcare provider. Document Revised: 08/17/2017 Document Reviewed: 08/17/2017 Elsevier Patient Education  2022 [place] A low-purine eating plan involves making food choices to limit your intake of purine. Purine is a kind of uric acid. Too much uric acid in your blood can cause certain conditions, such as gout and kidney stones. Eating a low-purinediet can help control these conditions. What are tips for following this plan? Reading food labels Avoid foods with saturated or Trans fat. Check the ingredient list of grains-based foods, such as bread and cereal, to make sure that they contain whole grains. Check the ingredient list of sauces or soups to make sure they do not contain meat or fish. When choosing soft drinks, check  the ingredient list to make sure they do not contain high-fructose corn syrup. Shopping  Buy plenty of fresh fruits and vegetables. Avoid buying canned or fresh fish. Buy dairy products labeled as low-fat or nonfat. Avoid buying premade or processed foods. These foods are often high in fat, salt (sodium), and added sugar.  Cooking Use olive oil instead of butter when cooking. Oils like olive oil, canola oil, and sunflower oil contain healthy fats. Meal planning Learn which foods do or do not affect you. If you find out that a food tends to cause your gout symptoms to flare up, avoid eating that food. You can enjoy foods that do not cause problems. If you have any questions about a food item, talk with your dietitian or health care provider. Limit  foods high in fat, especially saturated fat. Fat makes it harder for your body to get rid of uric acid. Choose foods that are lower in fat and are lean sources of protein. General guidelines Limit alcohol intake to no more than 1 drink a day for nonpregnant women and 2 drinks a day for men. One drink equals 12 oz of beer, 5 oz of wine, or 1 oz of hard liquor. Alcohol can affect the way your body gets rid of uric acid. Drink plenty of water to keep your urine clear or pale yellow. Fluids can help remove uric acid from your body. If directed by your health care provider, take a vitamin C supplement. Work with your health care provider and dietitian to develop a plan to achieve or maintain a healthy weight. Losing weight can help reduce uric acid in your blood. What foods are recommended? The items listed may not be a complete list. Talk with your dietitian aboutwhat dietary choices are best for you. Foods low in purines Foods low in purines do not need to be limited. These include: All fruits. All low-purine vegetables, pickles, and olives. Breads, pasta, rice, cornbread, and popcorn. Cake and other baked goods. All dairy foods. Eggs, nuts, and nut butters. Spices and condiments, such as salt, herbs, and vinegar. Plant oils, butter, and margarine. Water, sugar-free soft drinks, tea, coffee, and cocoa. Vegetable-based soups, broths, sauces, and gravies. Foods moderate in purines Foods moderate in purines should be limited to the amounts listed.  cup of asparagus, cauliflower, spinach, mushrooms, or green peas, each day. 2/3 cup uncooked oatmeal, each day.  cup dry wheat bran or wheat germ, each day. 2-3 ounces of meat or poultry, each day. 4-6 ounces of shellfish, such as crab, lobster, oysters, or shrimp, each day. 1 cup cooked beans, peas, or lentils, each day. Soup, broths, or bouillon made from meat or fish. Limit these foods as much as possible. What foods are not recommended? The  items listed may not be a complete list. Talk with your dietitian aboutwhat dietary choices are best for you. Limit your intake of foods high in purines, including: Beer and other alcohol. Meat-based gravy or sauce. Canned or fresh fish, such as: Anchovies, sardines, herring, and tuna. Mussels and scallops. Codfish, trout, and haddock. Berniece Salines. Organ meats, such as: Liver or kidney. Tripe. Sweetbreads (thymus gland or pancreas). Wild Clinical biochemist. Yeast or yeast extract supplements. Drinks sweetened with high-fructose corn syrup. Summary Eating a low-purine diet can help control conditions caused by too much uric acid in the body, such as gout or kidney stones. Choose low-purine foods, limit alcohol, and limit foods high in fat. You will learn over time which foods do or  do not affect you. If you find out that a food tends to cause your gout symptoms to flare up, avoid eating that food. This information is not intended to replace advice given to you by your health care provider. Make sure you discuss any questions you have with your healthcare provider. Document Revised: 05/10/2019 Document Reviewed: 05/10/2019 Elsevier Patient Education  [place].

## 2020-09-10 NOTE — Progress Notes (Signed)
Chief Complaint  Patient presents with   Follow-up   F/u  1. Gout on 50 mg qd elevated uric acid 8.3  2. Htn lis 20 mg bid, hctz 25 takes prn and lasix 20 mg qd on lopressor 25 mg bid and taking cardura 1 mg at 2 pm since end 08/2020 BP improved  3. Elevated PSA will recheck disc with pt declines further w/u if elevated again     Review of Systems  Constitutional:  Negative for weight loss.  HENT:  Negative for hearing loss.   Eyes:  Negative for blurred vision.  Respiratory:  Negative for shortness of breath.   Cardiovascular:  Negative for chest pain.  Gastrointestinal:  Negative for abdominal pain.  Musculoskeletal:  Negative for falls and joint pain.  Skin:  Negative for rash.       Left lower leg black lesion  Neurological:  Negative for headaches.  Psychiatric/Behavioral:  Negative for depression.   Past Medical History:  Diagnosis Date   CKD (chronic kidney disease), stage III ([place])    Coronary artery disease    s/p cabg triple bypass in 2011    Elevated PSA    Elevated TSH    Enlarged prostate    History of influenza    Hyperlipidemia    Hypertension    Past Surgical History:  Procedure Laterality Date   CORONARY ARTERY BYPASS GRAFT     x 3 vessel   triple bypass  2011   Family History  Problem Relation Age of Onset   Heart attack Mother    Heart disease Mother    Heart disease Father    Diabetes Sister        type 2    Kidney disease Sister    Kidney failure Sister        stage 13 in 58s as of 09/2020   Stroke Maternal Grandmother    Heart attack Maternal Grandfather    Stroke Maternal Grandfather    Heart disease Maternal Grandfather    Heart attack Paternal Grandmother    Heart disease Paternal Grandmother    Stroke Paternal Grandfather    Diabetes Son        type 1   Diabetes Grandchild        type 2   Social History   Socioeconomic History   Marital status: Married    Spouse name: Not on file   Number of children: Not on file   Years of  education: Not on file   Highest education level: Not on file  Occupational History   Not on file  Tobacco Use   Smoking status: Former    Packs/day: 1.50    Years: 30.00    Pack years: 45.00    Types: Cigarettes   Smokeless tobacco: Never  Vaping Use   Vaping Use: Never used  Substance and Sexual Activity   Alcohol use: No   Drug use: No   Sexual activity: Not Currently  Other Topics Concern   Not on file  Social History Narrative   Married    BS degree chemistry    Kids 16 y.o son    Owns guns, wears seat belt, safe in relationship    Social Determinants of Health   Financial Resource Strain: Not on file  Food Insecurity: Not on file  Transportation Needs: Not on file  Physical Activity: Not on file  Stress: Not on file  Social Connections: Not on file  Intimate Partner Violence: Not on file  Current Meds  Medication Sig   allopurinol (ZYLOPRIM) 100 MG tablet Take 0.5 tablets (50 mg total) by mouth daily.   clobetasol cream (TEMOVATE) [id number] % Apply 1 application topically 2 (two) times daily. Lower legs itching and rash   doxazosin (CARDURA) 1 MG tablet Take 1 tablet (1 mg total) by mouth 2 (two) times daily. Hold for systolic blood pressure less than 120   furosemide (LASIX) 20 MG tablet Take 20 mg by mouth daily as needed. Take 20 mg tablet by mouth daily on the days you do not take HCTZ.   hydrochlorothiazide (HYDRODIURIL) 25 MG tablet Take 25 mg by mouth daily.   NAPROXEN PO Take by mouth.   [DISCONTINUED] EUTHYROX 75 MCG tablet TAKE 1 TABLET BY MOUTH ONCE DAILY BEFORE BREAKFAST   [DISCONTINUED] lisinopril (ZESTRIL) 20 MG tablet Take 1 tablet (20 mg total) by mouth 2 (two) times daily.   [DISCONTINUED] metoprolol tartrate (LOPRESSOR) 25 MG tablet Take 1 tablet (25 mg total) by mouth 2 (two) times daily.   Allergies  Allergen Reactions   Lipitor [Atorvastatin Calcium]     Muscle aches   No results found for this or any previous visit (from the past 2160  hour(s)). Objective  Body mass index is 38.37 kg/m. Wt Readings from Last 3 Encounters:  09/10/20 267 lb 6.4 oz (121.3 kg)  08/15/20 270 lb 8 oz (122.7 kg)  05/01/20 273 lb 6 oz (124 kg)   Temp Readings from Last 3 Encounters:  09/10/20 (!) 96 F (35.6 C) (Temporal)  05/01/20 98.1 F (36.7 C)  04/26/18 98.9 F (37.2 C) (Oral)   BP Readings from Last 3 Encounters:  09/10/20 140/86  08/15/20 (!) 160/100  05/01/20 132/88   Pulse Readings from Last 3 Encounters:  09/10/20 78  08/15/20 75  05/01/20 74    Physical Exam Vitals and nursing note reviewed.  Constitutional:      Appearance: Normal appearance. He is well-developed and well-groomed. He is obese.  HENT:     Head: Normocephalic and atraumatic.     Right Ear: There is impacted cerumen.     Left Ear: There is impacted cerumen.  Eyes:     Conjunctiva/sclera: Conjunctivae normal.     Pupils: Pupils are equal, round, and reactive to light.  Cardiovascular:     Rate and Rhythm: Normal rate and regular rhythm.     Heart sounds: Normal heart sounds. No murmur heard. Pulmonary:     Effort: Pulmonary effort is normal.     Breath sounds: Normal breath sounds.  Skin:    General: Skin is warm and dry.  Neurological:     General: No focal deficit present.     Mental Status: He is alert and oriented to person, place, and time. Mental status is at baseline.     Gait: Gait normal.  Psychiatric:        Attention and Perception: Attention and perception normal.        Mood and Affect: Mood and affect normal.        Speech: Speech normal.        Behavior: Behavior normal. Behavior is cooperative.        Thought Content: Thought content normal.        Cognition and Memory: Cognition and memory normal.        Judgment: Judgment normal.    Assessment  Plan  Gout, unspecified cause, unspecified chronicity, unspecified site - Plan: Uric acid On allopurinol 50 mg qd  Prediabetes - Plan: Hemoglobin A1c  Hypertension, with  CKD 3B - Plan: Comprehensive metabolic panel, Lipid panel, CBC with Differential/Platelet lis 20 mg bid, hctz 25 takes prn and lasix 20 mg qd on lopressor 25 mg bid and taking cardura 1 mg at 2 pm since end 08/2020 BP improved  PSA elevation - Plan: PSA, total and free Consider MRI prostate pt declines for now further w/u  Hypothyroidism, unspecified type - Plan: TSH, levothyroxine (EUTHYROX) 75 MCG tablet  Skin lesion - Plan: Ambulatory referral to Dermatology Skin cancer screening - Plan: Ambulatory referral to Dermatology  HM Flu shot due had flu in 2006 and was very sick sch rec get Tdap  [employer] 3/3 Will disc shingrix in future Consider prevnar, pna 23 decline for now  hep C neg    PSA elevated consider urology in future denied in past declines prostate MRI 11/02/19 he does not want to know if he has prostate cancer and this is why     Ref. Range 11/05/2019 09:11  PSA Latest Ref Range: 0.10 - 4.00 ng/mL 7.74 (H)      Denies colonoscopy  -declines cologuard   CT chest consider former smoker [id number] 1-2 ppd no FH lung cancer reviewed to consider prev discussed    Provider: Dr. Olivia Mackie McLean-Scocuzza-[facility]

## 2020-09-11 LAB — PSA, TOTAL AND FREE
PSA, % Free: 50 % (calc) (ref >25)
PSA, Free: 4.2 ng/mL
PSA, Total: 8.4 ng/mL — ABNORMAL HIGH (ref <=4.0)

## 2020-09-15 ENCOUNTER — Other Ambulatory Visit: Payer: Self-pay | Admitting: Internal Medicine

## 2020-09-15 DIAGNOSIS — M109 Gout, unspecified: Secondary | ICD-10-CM

## 2020-09-15 DIAGNOSIS — N1832 Chronic kidney disease, stage 3b: Secondary | ICD-10-CM

## 2020-09-15 MED ORDER — ALLOPURINOL 100 MG PO TABS: 100.0000 mg | ORAL_TABLET | Freq: Every day | ORAL | 3 refills | Status: DC

## 2020-09-29 ENCOUNTER — Other Ambulatory Visit (INDEPENDENT_AMBULATORY_CARE_PROVIDER_SITE_OTHER): Payer: Medicare PPO

## 2020-09-29 ENCOUNTER — Other Ambulatory Visit: Payer: Self-pay

## 2020-09-29 DIAGNOSIS — N1832 Chronic kidney disease, stage 3b: Secondary | ICD-10-CM | POA: Diagnosis not present

## 2020-09-29 LAB — BASIC METABOLIC PANEL
BUN: 22 mg/dL (ref 6–23)
CO2: 29 mEq/L (ref 19–32)
Calcium: 9.5 mg/dL (ref 8.4–10.5)
Chloride: 100 mEq/L (ref 96–112)
Creatinine, Ser: 1.65 mg/dL — ABNORMAL HIGH (ref 0.40–1.50)
GFR: 40.26 mL/min — ABNORMAL LOW (ref >60.00)
Glucose, Bld: 88 mg/dL (ref 70–99)
Potassium: 4.2 mEq/L (ref 3.5–5.1)
Sodium: 138 mEq/L (ref 135–145)

## 2020-10-02 ENCOUNTER — Telehealth: Payer: Self-pay

## 2020-10-02 NOTE — Telephone Encounter (Signed)
Pt called and states that he was supposed to have uric acid checked again when he came on 09/29/20. He is upset that it was not done because he has to drive an hour to get here. He would like that to be checked. Maybe order for somewhere closer to him? Please advise.

## 2020-10-03 ENCOUNTER — Other Ambulatory Visit: Payer: Self-pay | Admitting: *Deleted

## 2020-10-03 ENCOUNTER — Other Ambulatory Visit (INDEPENDENT_AMBULATORY_CARE_PROVIDER_SITE_OTHER): Payer: Medicare PPO

## 2020-10-03 DIAGNOSIS — M109 Gout, unspecified: Secondary | ICD-10-CM

## 2020-10-03 LAB — URIC ACID: Uric Acid, Serum: 6.3 mg/dL (ref 4.0–7.8)

## 2020-10-03 NOTE — Telephone Encounter (Signed)
Uric acid has been ordered for future labs.

## 2020-10-03 NOTE — Telephone Encounter (Signed)
I called patient & explain to him that he wasn't supposed to have a uric acid level checked. His lab appt was to check his kidney function since his allopurinol dose was increased. I also offered to add a uric acid to his recent lab if he really wanted it rechecked again this month (last checked on 09/10/20). Pt agreed & add on sheet faxed.

## 2020-12-15 ENCOUNTER — Other Ambulatory Visit: Payer: Self-pay | Admitting: Cardiovascular Disease

## 2020-12-15 NOTE — Telephone Encounter (Signed)
Please advise if ok to refill Historical Medication.

## 2021-01-17 ENCOUNTER — Other Ambulatory Visit: Payer: Self-pay | Admitting: Cardiovascular Disease

## 2021-03-17 ENCOUNTER — Ambulatory Visit: Payer: Medicare PPO | Admitting: Internal Medicine

## 2021-03-17 ENCOUNTER — Encounter: Payer: Self-pay | Admitting: Internal Medicine

## 2021-03-17 ENCOUNTER — Other Ambulatory Visit: Payer: Self-pay

## 2021-03-17 VITALS — BP 120/82 | HR 79 | Temp 98.8°F | Ht 70.0 in | Wt 279.1 lb

## 2021-03-17 DIAGNOSIS — N183 Chronic kidney disease, stage 3 unspecified: Secondary | ICD-10-CM | POA: Diagnosis not present

## 2021-03-17 DIAGNOSIS — M109 Gout, unspecified: Secondary | ICD-10-CM | POA: Diagnosis not present

## 2021-03-17 DIAGNOSIS — E039 Hypothyroidism, unspecified: Secondary | ICD-10-CM

## 2021-03-17 DIAGNOSIS — Z23 Encounter for immunization: Secondary | ICD-10-CM

## 2021-03-17 DIAGNOSIS — Z66 Do not resuscitate: Secondary | ICD-10-CM | POA: Diagnosis not present

## 2021-03-17 DIAGNOSIS — R32 Unspecified urinary incontinence: Secondary | ICD-10-CM

## 2021-03-17 DIAGNOSIS — R7303 Prediabetes: Secondary | ICD-10-CM

## 2021-03-17 DIAGNOSIS — R972 Elevated prostate specific antigen [PSA]: Secondary | ICD-10-CM

## 2021-03-17 DIAGNOSIS — Z7189 Other specified counseling: Secondary | ICD-10-CM

## 2021-03-17 DIAGNOSIS — I10 Essential (primary) hypertension: Secondary | ICD-10-CM

## 2021-03-17 HISTORY — DX: Other specified counseling: Z71.89

## 2021-03-17 MED ORDER — TETANUS-DIPHTH-ACELL PERTUSSIS 5-2.5-18.5 LF-MCG/0.5 IM SUSP: 0.5000 mL | Freq: Once | INTRAMUSCULAR | 0 refills | Status: AC

## 2021-03-17 NOTE — Patient Instructions (Addendum)
Consider prevnar 13 you can get this at the pharmacy with Tdap   Let me know if you want to see urology or [facility] dermatology  Allegra, claritin, zyrtec, xyzal try 1/2 pill as needed  Or prescription nose spray like Atrovent   Food Basics for Chronic Kidney Disease Chronic kidney disease (CKD) occurs when the kidneys are permanently damaged over a long period of time. When your kidneys are not working well, they cannot remove waste, fluids, and other substances from your blood as well as they did before. The substances can build up, which can worsen kidney damage and affect how your body functions. Certain foods lead to a buildup of these substances. By changing your diet, you can help prevent more kidney damage and delay or prevent the need for dialysis. What are tips for following this plan? Reading food labels Check the amount of salt (sodium) in foods. Choose foods that have less than 300 milligrams (mg) per serving. Check the ingredient list for phosphorus or potassium-based additives or preservatives. Check the amount of saturated fat and trans fat. Limit or avoid these fats as told by your dietitian. Shopping Avoid buying foods that are: Processed or prepackaged. Calcium-enriched or that have calcium added to them (are fortified). Do not buy foods that have salt or sodium listed among the first five ingredients. Buy canned vegetables and beans that say "no salt added" or "low sodium" and rinse them before eating. Cooking Soak vegetables, such as potatoes, before cooking to reduce potassium. To do this: Peel and cut the vegetables into small pieces. Soak the vegetables in warm water for at least 2 hours. For every 1 cup of vegetables, use 10 cups of water. Drain and rinse the vegetables with warm water. Boil the vegetables for at least 5 minutes. Meal planning Limit the amount of protein you eat from plant and animal sources each day. Do not add salt to food when cooking or before  eating. Eat meals and snacks at around the same time each day. General information Talk with your health care provider about whether you should take a vitamin and mineral supplement. Use standard measuring cups and spoons to measure servings of foods. Use a kitchen scale to measure portions of protein foods. If told by your health care provider, avoid drinking too much fluid. Measure and count all liquids, including water, ice, soups, flavored gelatin, and frozen desserts such as ice pops or ice cream. If you have diabetes: If you have diabetes (diabetes mellitus) and CKD, it is important to keep your blood sugar (glucose) in the target range recommended by your health care provider. Follow your diabetes management plan. This may include: Checking your blood glucose regularly. Taking medicines by mouth, taking insulin, or taking both. Exercising for at least 30 minutes on 5 or more days each week, or as told by your health care provider. Tracking how many servings of carbohydrates you eat at each meal. You may be given specific guidelines on how much of certain foods and nutrients you may eat, depending on your stage of kidney disease and whether you have high blood pressure (hypertension). Follow your meal plan as told by your dietitian. What nutrients should I limit? Work with your health care provider and dietitian to develop a meal plan that is right for you. Foods you can eat and foods you should limit or avoid will depend on the stage of your kidney disease and any other health conditions you have. The items listed below are not  a complete list. Talk with your dietitian about what dietary choices are best for you. Potassium Potassium affects how steadily your heart beats. If too much potassium builds up in your blood, the potassium can cause an irregular heartbeat or even a heart attack. You may need to limit or avoid foods that are high in potassium, such as: Milk and soy milk. Fruits, such  as bananas, apricots, nectarines, melon, prunes, raisins, kiwi, and oranges. Vegetables, such as potatoes, sweet potatoes, yams, tomatoes, leafy greens, beets, avocado, pumpkin, and winter squash. White and lima beans. Whole-wheat breads and pastas. Beans and nuts. Phosphorus Phosphorus is a mineral found in your bones. A balance between calcium and phosphorus is needed to build and maintain healthy bones. Too much phosphorus pulls calcium from your bones. This can make your bones weak and more likely to break. Too much phosphorus can also make your skin itch. You may need to limit or avoid foods that are high in phosphorus, such as: Milk and dairy products. Dried beans and peas. Tofu, soy milk, and other soy-based meat replacements. Dark-colored sodas. Nuts and peanut butter. Meat, poultry, and fish. Bran cereals and oatmeal. Protein Protein helps you make and keep muscle. It also helps to repair your body's cells and tissues. One of the natural breakdown products of protein is a waste product called urea. When your kidneys are not working properly, they cannot remove wastes, such as urea. Reducing how much protein you eat can help prevent a buildup of urea in your blood. Depending on your stage of kidney disease, you may need to limit foods that are high in protein. Sources of animal protein include: Meat (all types). Fish and seafood. Poultry. Eggs. Dairy. Other protein foods include: Beans and legumes. Nuts and nut butter. Soy and tofu.  Sodium Sodium helps to maintain a healthy balance of fluids in your body. Too much sodium can increase your blood pressure and have a negative effect on your heart and lungs. Too much sodium can also cause your body to retain too much fluid, making your kidneys work harder. Most people should have less than 2,300 mg of sodium each day. If you have hypertension, you may need to limit your sodium to 1,500 mg each day. You may need to limit or avoid  foods that are high in sodium, such as: Salt seasonings. Soy sauce. Cured and processed meats. Salted crackers and snack foods. Fast food. Canned soups and most canned foods. Pickled foods. Vegetable juice. Boxed mixes or ready-to-eat boxed meals and side dishes. Bottled dressings, sauces, and marinades. Talk with your dietitian about how much potassium, phosphorus, protein, and sodium you may have each day. Summary Chronic kidney disease (CKD) can lead to a buildup of waste and extra substances in the body. Certain foods lead to a buildup of these substances. By changing your diet as told, you can help prevent more kidney damage and delay or prevent the need for dialysis. Food intake changes are different for each person with CKD. Work with a dietitian to set up nutrient goals and a meal plan that is right for you. If you have diabetes and CKD, it is important to keep your blood sugar in the target range recommended by your health care provider. This information is not intended to replace advice given to you by your health care provider. Make sure you discuss any questions you have with your health care provider. Document Revised: 05/21/2019 Document Reviewed: 05/21/2019 Elsevier Patient Education  2022 [employer].  Chronic Kidney Disease, Adult Chronic kidney disease (CKD) occurs when the kidneys are slowly and permanently damaged over a long period of time. The kidneys are a pair of organs that do many important jobs in the body, including: Removing waste and extra fluid from the blood to make urine. Making hormones that maintain the amount of fluid in tissues and blood vessels. Maintaining the right amount of fluids and chemicals in the body. A small amount of kidney damage may not cause problems, but a large amount of damage may make it hard or impossible for the kidneys to work right. Steps must be taken to slow kidney damage or to stop it from getting worse. If steps are not taken,  the kidneys may stop working permanently (end-stage renal disease, or ESRD). Most of the time, CKD does not go away, but it can often be controlled. People who have CKD are usually able to live full lives. What are the causes? The most common causes of this condition are diabetes and high blood pressure (hypertension). Other causes include: Cardiovascular diseases. These affect the heart and blood vessels. Kidney diseases. These include: Glomerulonephritis, or inflammation of the tiny filters in the kidneys. Interstitial nephritis. This is swelling of the small tubes of the kidneys and of the surrounding structures. Polycystic kidney disease, in which clusters of fluid-filled sacs form within the kidneys. Renal vascular disease. This includes disorders that affect the arteries and veins of the kidneys. Diseases that affect the body's defense system (immune system). A problem with urine flow. This may be caused by: Kidney stones. Cancer. An enlarged prostate, in males. A kidney infection or urinary tract infection (UTI) that keeps coming back. Vasculitis. This is swelling or inflammation of the blood vessels. What increases the risk? Your chances of having kidney disease increase with age. The following factors may make you more likely to develop this condition: A family history of kidney disease or kidney failure. Kidney failure means the kidneys can no longer work right. Certain genetic diseases. Taking medicines often that are damaging to the kidneys. Being around or being in contact with toxic substances. Obesity. A history of tobacco use. What are the signs or symptoms? Symptoms of this condition include: Feeling very tired (lethargic) and having less energy. Swelling, or edema, of the face, legs, ankles, or feet. Nausea or vomiting, or loss of appetite. Confusion or trouble concentrating. Muscle twitches and cramps, especially in the legs. Dry, itchy skin. A metallic taste in  the mouth. Producing less urine, or producing more urine (especially at night). Shortness of breath. Trouble sleeping. CKD may also result in not having enough red blood cells or hemoglobin in the blood (anemia) or having weak bones (bone disease). Symptoms develop slowly and may not be obvious until the kidney damage becomes severe. It is possible to have kidney disease for years without having symptoms. How is this diagnosed? This condition may be diagnosed based on: Blood tests. Urine tests. Imaging tests, such as an ultrasound or a CT scan. A kidney biopsy. This involves removing a sample of kidney tissue to be looked at under a microscope. Results from these tests will help to determine how serious the CKD is. How is this treated? There is no cure for most cases of this condition, but treatment usually relieves symptoms and prevents or slows the worsening of the disease. Treatment may include: Diet changes, which may require you to avoid alcohol and foods that are high in salt, potassium, phosphorous, and protein. Medicines. These  may: Lower blood pressure. Control blood sugar (glucose). Relieve anemia. Relieve swelling. Protect your bones. Improve the balance of salts and minerals in your blood (electrolytes). Dialysis, which is a type of treatment that removes toxic waste from the body. It may be needed if you have kidney failure. Managing any other conditions that are causing your CKD or making it worse. Follow these instructions at home: Medicines Take over-the-counter and prescription medicines only as told by your health care provider. The amount of some medicines that you take may need to be changed. Do not take any new medicines unless approved by your health care provider. Many medicines can make kidney damage worse. Do not take any vitamin and mineral supplements unless approved by your health care provider. Many nutritional supplements can make kidney damage  worse. Lifestyle  Do not use any products that contain nicotine or tobacco, such as cigarettes, e-cigarettes, and chewing tobacco. If you need help quitting, ask your health care provider. If you drink alcohol: Limit how much you use to: 0-1 drink a day for women who are not pregnant. 0-2 drinks a day for men. Know how much alcohol is in your drink. In the [place] one drink equals one 12 oz bottle of beer (355 mL), one 5 oz glass of wine (148 mL), or one 1 oz glass of hard liquor (44 mL). Maintain a healthy weight. If you need help, ask your health care provider. General instructions  Follow instructions from your health care provider about eating or drinking restrictions, including any prescribed diet. Track your blood pressure at home. Report changes in your blood pressure as told. If you are being treated for diabetes, track your blood glucose levels as told. Start or continue an exercise plan. Exercise at least 30 minutes a day, 5 days a week. Keep your immunizations up to date as told. Keep all follow-up visits. This is important. Where to find more information American Association of Kidney Patients: BombTimer.gl National Kidney Foundation: www.kidney.[place]: https://mathis.com/ Life Options: www.lifeoptions.org Kidney School: www.kidneyschool.org Contact a health care provider if: Your symptoms get worse. You develop new symptoms. Get help right away if: You develop symptoms of ESRD. These include: Headaches. Numbness in your hands or feet. Easy bruising. Frequent hiccups. Chest pain. Shortness of breath. Lack of menstrual periods, in women. You have a fever. You are producing less urine than usual. You have pain or bleeding when you urinate or when you have a bowel movement. These symptoms may represent a serious problem that is an emergency. Do not wait to see if the symptoms will go away. Get medical help right away. Call your local emergency services (911  in the [place] Do not drive yourself to the hospital. Summary Chronic kidney disease (CKD) occurs when the kidneys become damaged slowly over a long period of time. The most common causes of this condition are diabetes and high blood pressure (hypertension). There is no cure for most cases of CKD, but treatment usually relieves symptoms and prevents or slows the worsening of the disease. Treatment may include a combination of lifestyle changes, medicines, and dialysis. This information is not intended to replace advice given to you by your health care provider. Make sure you discuss any questions you have with your health care provider. Document Revised: 05/02/2019 Document Reviewed: 05/02/2019 Elsevier Patient Education  [place].  Pneumococcal Conjugate Vaccine (Prevnar 13) Suspension for Injection What is this medication? PNEUMOCOCCAL VACCINE (NEU mo KOK al vak SEEN) is a vaccine  used to prevent pneumococcus bacterial infections. These bacteria can cause serious infections like pneumonia, meningitis, and blood infections. This vaccine will lower your chance of getting pneumonia. If you do get pneumonia, it can make your symptoms milder and your illness shorter. This vaccine will not treat an infection and will not cause infection. This vaccine is recommended for infants and young children, adults with certain medical conditions, and adults 90 years or older. This medicine may be used for other purposes; ask your health care provider or pharmacist if you have questions. COMMON BRAND NAME(S): Prevnar, Prevnar 13 What should I tell my care team before I take this medication? They need to know if you have any of these conditions: bleeding problems fever immune system problems an unusual or allergic reaction to pneumococcal vaccine, diphtheria toxoid, other vaccines, latex, other medicines, foods, dyes, or preservatives pregnant or trying to get pregnant breast-feeding How should I use  this medication? This vaccine is for injection into a muscle. It is given by a health care professional. A copy of Vaccine Information Statements will be given before each vaccination. Read this sheet carefully each time. The sheet may change frequently. Talk to your pediatrician regarding the use of this medicine in children. While this drug may be prescribed for children as young as 3 weeks old for selected conditions, precautions do apply. Overdosage: If you think you have taken too much of this medicine contact a poison control center or emergency room at once. NOTE: This medicine is only for you. Do not share this medicine with others. What if I miss a dose? It is important not to miss your dose. Call your doctor or health care professional if you are unable to keep an appointment. What may interact with this medication? medicines for cancer chemotherapy medicines that suppress your immune function steroid medicines like prednisone or cortisone This list may not describe all possible interactions. Give your health care provider a list of all the medicines, herbs, non-prescription drugs, or dietary supplements you use. Also tell them if you smoke, drink alcohol, or use illegal drugs. Some items may interact with your medicine. What should I watch for while using this medication? Mild fever and pain should go away in 3 days or less. Report any unusual symptoms to your doctor or health care professional. What side effects may I notice from receiving this medication? Side effects that you should report to your doctor or health care professional as soon as possible: allergic reactions like skin rash, itching or hives, swelling of the face, lips, or tongue breathing problems confused fast or irregular heartbeat fever over 102 degrees F seizures unusual bleeding or bruising unusual muscle weakness Side effects that usually do not require medical attention (report to your doctor or health care  professional if they continue or are bothersome): aches and pains diarrhea fever of 102 degrees F or less headache irritable loss of appetite pain, tender at site where injected trouble sleeping This list may not describe all possible side effects. Call your doctor for medical advice about side effects. You may report side effects to FDA at 1-800-FDA-1088. Where should I keep my medication? This does not apply. This vaccine is given in a clinic, pharmacy, doctor's office, or other health care setting and will not be stored at home. NOTE: This sheet is a summary. It may not cover all possible information. If you have questions about this medicine, talk to your doctor, pharmacist, or health care provider.  2022 Elsevier/Gold Standard (2013-11-01  00:00:00) ° °

## 2021-03-17 NOTE — Progress Notes (Signed)
Chief Complaint  Patient presents with   Follow-up   6 month F/u  1. Htn improved on lasix 20 and cardura 1 qhs lis 20 mg bid and lopressor 25 mg bid  2. Gout stopped allopurinol 50-100 mg 3. Pt wants DNR form today  4. Elevated PSA and urinary leakage thinking about urology referral declines for now nocturia 1x at night     Review of Systems  Constitutional:  Negative for weight loss.  HENT:  Negative for hearing loss.   Eyes:  Negative for blurred vision.  Respiratory:  Negative for shortness of breath.   Cardiovascular:  Negative for chest pain.  Gastrointestinal:  Negative for abdominal pain and blood in stool.  Genitourinary:  Negative for dysuria.  Musculoskeletal:  Negative for back pain, falls and joint pain.  Skin:  Negative for rash.  Neurological:  Negative for headaches.  Psychiatric/Behavioral:  Negative for depression.   Past Medical History:  Diagnosis Date   CKD (chronic kidney disease), stage III ([place])    Coronary artery disease    s/p cabg triple bypass in 2011    Elevated PSA    Elevated TSH    Enlarged prostate    History of influenza    Hyperlipidemia    Hypertension    Past Surgical History:  Procedure Laterality Date   CORONARY ARTERY BYPASS GRAFT     x 3 vessel   triple bypass  2011   Family History  Problem Relation Age of Onset   Heart attack Mother    Heart disease Mother    Heart disease Father    Diabetes Sister        type 2    Kidney disease Sister    Kidney failure Sister        stage 2 in 79s as of 09/2020   Stroke Maternal Grandmother    Heart attack Maternal Grandfather    Stroke Maternal Grandfather    Heart disease Maternal Grandfather    Heart attack Paternal Grandmother    Heart disease Paternal Grandmother    Stroke Paternal Grandfather    Diabetes Son        type 1   Diabetes Grandchild        type 2   Social History   Socioeconomic History   Marital status: Married    Spouse name: Not on file   Number of  children: Not on file   Years of education: Not on file   Highest education level: Not on file  Occupational History   Not on file  Tobacco Use   Smoking status: Former    Packs/day: 1.50    Years: 30.00    Pack years: 45.00    Types: Cigarettes   Smokeless tobacco: Never  Vaping Use   Vaping Use: Never used  Substance and Sexual Activity   Alcohol use: No   Drug use: No   Sexual activity: Not Currently  Other Topics Concern   Not on file  Social History Narrative   Married    BS degree chemistry    Kids 60 y.o son    Owns guns, wears seat belt, safe in relationship    Social Determinants of Health   Financial Resource Strain: Not on file  Food Insecurity: Not on file  Transportation Needs: Not on file  Physical Activity: Not on file  Stress: Not on file  Social Connections: Not on file  Intimate Partner Violence: Not on file   Current Meds  Medication  Sig   clobetasol cream (TEMOVATE) 1.74 % Apply 1 application topically 2 (two) times daily. Lower legs itching and rash   doxazosin (CARDURA) 1 MG tablet Take 1 tablet (1 mg total) by mouth daily.   furosemide (LASIX) 20 MG tablet TAKE 1 TABLET BY MOUTH ONCE DAILY ON THE DAYS YOU DO NOT TAKE HCTZ   levothyroxine (EUTHYROX) 75 MCG tablet Take 1 tablet (75 mcg total) by mouth daily before breakfast.   lisinopril (ZESTRIL) 20 MG tablet Take 1 tablet (20 mg total) by mouth 2 (two) times daily.   metoprolol tartrate (LOPRESSOR) 25 MG tablet Take 1 tablet by mouth twice daily   NAPROXEN PO Take by mouth.   Tdap (BOOSTRIX) 5-2.5-18.5 LF-MCG/0.5 injection Inject 0.5 mLs into the muscle once for 1 dose.   [DISCONTINUED] allopurinol (ZYLOPRIM) 100 MG tablet Take 1 tablet (100 mg total) by mouth daily.   [DISCONTINUED] hydrochlorothiazide (HYDRODIURIL) 25 MG tablet Take 25 mg by mouth daily.   Allergies  Allergen Reactions   Lipitor [Atorvastatin Calcium]     Muscle aches   No results found for this or any previous visit  (from the past 2160 hour(s)). Objective  Body mass index is 40.05 kg/m. Wt Readings from Last 3 Encounters:  03/17/21 279 lb 1.6 oz (126.6 kg)  09/10/20 267 lb 6.4 oz (121.3 kg)  08/15/20 270 lb 8 oz (122.7 kg)   Temp Readings from Last 3 Encounters:  03/17/21 98.8 F (37.1 C) (Oral)  09/10/20 (!) 96 F (35.6 C) (Temporal)  05/01/20 98.1 F (36.7 C)   BP Readings from Last 3 Encounters:  03/17/21 120/82  09/10/20 140/86  08/15/20 (!) 160/100   Pulse Readings from Last 3 Encounters:  03/17/21 79  09/10/20 78  08/15/20 75    Physical Exam Vitals and nursing note reviewed.  Constitutional:      Appearance: Normal appearance. He is well-developed and well-groomed.  HENT:     Head: Normocephalic and atraumatic.  Eyes:     Conjunctiva/sclera: Conjunctivae normal.     Pupils: Pupils are equal, round, and reactive to light.  Cardiovascular:     Rate and Rhythm: Normal rate and regular rhythm.     Heart sounds: Normal heart sounds.  Pulmonary:     Effort: Pulmonary effort is normal. No respiratory distress.     Breath sounds: Normal breath sounds.  Abdominal:     Tenderness: There is no abdominal tenderness.  Skin:    General: Skin is warm and moist.  Neurological:     General: No focal deficit present.     Mental Status: He is alert and oriented to person, place, and time. Mental status is at baseline.     Sensory: Sensation is intact.     Motor: Motor function is intact.     Coordination: Coordination is intact.     Gait: Gait is intact. Gait normal.  Psychiatric:        Attention and Perception: Attention and perception normal.        Mood and Affect: Mood and affect normal.        Speech: Speech normal.        Behavior: Behavior normal. Behavior is cooperative.        Thought Content: Thought content normal.        Cognition and Memory: Cognition and memory normal.        Judgment: Judgment normal.    Assessment  Plan  Hypertension, improved with CKD3b  Plan: Comprehensive metabolic panel,  Lipid panel, CBC with Differential/Platelet lasix 20 and cardura 1 qhs lis 20 mg bid and lopressor 25 mg bid Fasting labs  DNR (do not resuscitate) DNR (do not resuscitate) discussion Disc and most disc filled out dnr today per pt request   Gout, controlled  He stopped allopurinol 50-100 mg qd   Prediabetes - Plan: Hemoglobin A1c  Hypothyroidism, unspecified type - Plan: TSH On levo 75 mcg qd   Urinary incontinence, unspecified type Elevated PSA  Consider urology he will think about this and let me know       PSA elevation - Plan: PSA, total and free Consider MRI prostate pt declines for now further w/u    Skin lesion - Plan: Ambulatory referral to Dermatology Skin cancer screening - Plan: Ambulatory referral to Dermatology He may want re-referral to [facility] in the future prior referral did not see   HM Flu shot due had flu in 2006 and was very sick sch 10 or 12/2020   rec get Tdap rx  [employer] 4/4 11/2020   Will disc shingrix in future Consider prevnar disc 03/17/21 declines, pna 23 decline for now hep C neg    PSA elevated consider urology in future denied in past declines prostate MRI 11/02/19 he does not want to know if he has prostate cancer and this is why     Ref. Range 11/05/2019 09:11  PSA Latest Ref Range: 0.10 - 4.00 ng/mL 7.74 (H)      Denies colonoscopy  -declines cologuard   CT chest consider former smoker [phone] 1-2 ppd no FH lung cancer reviewed to consider prev discussed    Provider: Dr. Olivia Mackie McLean-Scocuzza-Internal Medicine

## 2021-03-19 ENCOUNTER — Other Ambulatory Visit: Payer: Self-pay

## 2021-03-19 ENCOUNTER — Other Ambulatory Visit (INDEPENDENT_AMBULATORY_CARE_PROVIDER_SITE_OTHER): Payer: Medicare PPO

## 2021-03-19 DIAGNOSIS — R7303 Prediabetes: Secondary | ICD-10-CM

## 2021-03-19 DIAGNOSIS — I10 Essential (primary) hypertension: Secondary | ICD-10-CM

## 2021-03-19 DIAGNOSIS — N183 Chronic kidney disease, stage 3 unspecified: Secondary | ICD-10-CM

## 2021-03-19 DIAGNOSIS — M109 Gout, unspecified: Secondary | ICD-10-CM | POA: Diagnosis not present

## 2021-03-19 DIAGNOSIS — E039 Hypothyroidism, unspecified: Secondary | ICD-10-CM | POA: Diagnosis not present

## 2021-03-19 LAB — COMPREHENSIVE METABOLIC PANEL
ALT: 16 U/L (ref 0–53)
AST: 15 U/L (ref 0–37)
Albumin: 3.8 g/dL (ref 3.5–5.2)
Alkaline Phosphatase: 107 U/L (ref 39–117)
BUN: 18 mg/dL (ref 6–23)
CO2: 30 mEq/L (ref 19–32)
Calcium: 9.5 mg/dL (ref 8.4–10.5)
Chloride: 102 mEq/L (ref 96–112)
Creatinine, Ser: 1.66 mg/dL — ABNORMAL HIGH (ref 0.40–1.50)
GFR: 39.84 mL/min — ABNORMAL LOW (ref >60.00)
Glucose, Bld: 100 mg/dL — ABNORMAL HIGH (ref 70–99)
Potassium: 4.4 mEq/L (ref 3.5–5.1)
Sodium: 140 mEq/L (ref 135–145)
Total Bilirubin: 0.8 mg/dL (ref 0.2–1.2)
Total Protein: 7.3 g/dL (ref 6.0–8.3)

## 2021-03-19 LAB — CBC WITH DIFFERENTIAL/PLATELET
Basophils Absolute: 0.1 10*3/uL (ref 0.0–0.1)
Basophils Relative: 0.6 % (ref 0.0–3.0)
Eosinophils Absolute: 0.3 10*3/uL (ref 0.0–0.7)
Eosinophils Relative: 2.4 % (ref 0.0–5.0)
HCT: 41.8 % (ref 39.0–52.0)
Hemoglobin: 13.7 g/dL (ref 13.0–17.0)
Lymphocytes Relative: 12.2 % (ref 12.0–46.0)
Lymphs Abs: 1.4 10*3/uL (ref 0.7–4.0)
MCHC: 32.8 g/dL (ref 30.0–36.0)
MCV: 89.2 fl (ref 78.0–100.0)
Monocytes Absolute: 0.8 10*3/uL (ref 0.1–1.0)
Monocytes Relative: 7.1 % (ref 3.0–12.0)
Neutro Abs: 8.7 10*3/uL — ABNORMAL HIGH (ref 1.4–7.7)
Neutrophils Relative %: 77.7 % — ABNORMAL HIGH (ref 43.0–77.0)
Platelets: 298 10*3/uL (ref 150.0–400.0)
RBC: 4.68 Mil/uL (ref 4.22–5.81)
RDW: 14.5 % (ref 11.5–15.5)
WBC: 11.2 10*3/uL — ABNORMAL HIGH (ref 4.0–10.5)

## 2021-03-19 LAB — LIPID PANEL
Cholesterol: 189 mg/dL (ref 0–200)
HDL: 36 mg/dL — ABNORMAL LOW (ref >39.00)
LDL Cholesterol: 115 mg/dL — ABNORMAL HIGH (ref 0–99)
NonHDL: 152.7
Total CHOL/HDL Ratio: 5
Triglycerides: 191 mg/dL — ABNORMAL HIGH (ref 0.0–149.0)
VLDL: 38.2 mg/dL (ref 0.0–40.0)

## 2021-03-19 LAB — URIC ACID: Uric Acid, Serum: 8.3 mg/dL — ABNORMAL HIGH (ref 4.0–7.8)

## 2021-03-19 LAB — TSH: TSH: 3.85 u[IU]/mL (ref 0.35–5.50)

## 2021-03-19 LAB — HEMOGLOBIN A1C: Hgb A1c MFr Bld: 5.7 % (ref 4.6–6.5)

## 2021-03-20 LAB — URINALYSIS, ROUTINE W REFLEX MICROSCOPIC
Bacteria, UA: NONE SEEN /HPF
Bilirubin Urine: NEGATIVE
Glucose, UA: NEGATIVE
Hgb urine dipstick: NEGATIVE
Ketones, ur: NEGATIVE
Leukocytes,Ua: NEGATIVE
Nitrite: NEGATIVE
Specific Gravity, Urine: 1.01 (ref 1.001–1.035)
Squamous Epithelial / HPF: NONE SEEN /HPF (ref <=5)
WBC, UA: NONE SEEN /HPF (ref 0–5)
pH: 7 (ref 5.0–8.0)

## 2021-03-20 LAB — MICROALBUMIN / CREATININE URINE RATIO
Creatinine, Urine: 83 mg/dL (ref 20–320)
Microalb Creat Ratio: 647 mcg/mg creat — ABNORMAL HIGH (ref <30)
Microalb, Ur: 53.7 mg/dL

## 2021-03-20 LAB — MICROSCOPIC MESSAGE

## 2021-03-20 LAB — SODIUM, URINE, RANDOM: Sodium, Ur: 79 mmol/L (ref 28–272)

## 2021-09-02 ENCOUNTER — Ambulatory Visit: Payer: Medicare PPO | Admitting: Medical

## 2021-09-02 ENCOUNTER — Ambulatory Visit (INDEPENDENT_AMBULATORY_CARE_PROVIDER_SITE_OTHER): Payer: Medicare PPO

## 2021-09-02 ENCOUNTER — Encounter: Payer: Self-pay | Admitting: Medical

## 2021-09-02 ENCOUNTER — Other Ambulatory Visit
Admission: RE | Admit: 2021-09-02 | Discharge: 2021-09-02 | Disposition: A | Discharge status: Home / Self Care | Payer: Medicare PPO | Attending: Medical | Admitting: Medical

## 2021-09-02 VITALS — BP 186/113 | HR 69 | Ht 70.0 in | Wt 266.0 lb

## 2021-09-02 DIAGNOSIS — I25118 Atherosclerotic heart disease of native coronary artery with other forms of angina pectoris: Secondary | ICD-10-CM

## 2021-09-02 DIAGNOSIS — R42 Dizziness and giddiness: Secondary | ICD-10-CM | POA: Diagnosis not present

## 2021-09-02 DIAGNOSIS — E785 Hyperlipidemia, unspecified: Secondary | ICD-10-CM

## 2021-09-02 DIAGNOSIS — R296 Repeated falls: Secondary | ICD-10-CM | POA: Diagnosis not present

## 2021-09-02 DIAGNOSIS — I10 Essential (primary) hypertension: Secondary | ICD-10-CM | POA: Diagnosis not present

## 2021-09-02 DIAGNOSIS — R55 Syncope and collapse: Secondary | ICD-10-CM

## 2021-09-02 DIAGNOSIS — W19XXXS Unspecified fall, sequela: Secondary | ICD-10-CM

## 2021-09-02 LAB — TSH: TSH: 2.714 u[IU]/mL (ref 0.350–4.500)

## 2021-09-02 MED ORDER — CARVEDILOL 12.5 MG PO TABS: 12.5000 mg | ORAL_TABLET | Freq: Two times a day (BID) | ORAL | 5 refills | Status: DC

## 2021-09-02 MED ORDER — DOXAZOSIN MESYLATE 2 MG PO TABS: 2.0000 mg | ORAL_TABLET | Freq: Every day | ORAL | 5 refills | Status: DC

## 2021-09-02 NOTE — Patient Instructions (Addendum)
Medication Instructions:  Your physician has recommended you make the following change in your medication:   STOP Metoprolol  START Carvedilol 12.5 mg twice a day. An Rx has been sent to your phramacy  INCREASE Cardura to 2 mg daily  *If you need a refill on your cardiac medications before your next appointment, please call your pharmacy*   Lab Work: TSH today  Please have your lab drawn at the [facility]. Stop at the Registration desk to check in  If you have labs (blood work) drawn today and your tests are completely normal, you will receive your results only by: [place] (if you have MyChart) OR A paper copy in the mail If you have any lab test that is abnormal or we need to change your treatment, we will call you to review the results.   Testing/Procedures: Your physician has requested that you have an echocardiogram. Echocardiography is a painless test that uses sound waves to create images of your heart. It provides your doctor with information about the size and shape of your heart and how well your heart's chambers and valves are working. This procedure takes approximately one hour. There are no restrictions for this procedure.  Your physician has requested that you have a carotid duplex. This test is an ultrasound of the carotid arteries in your neck. It looks at blood flow through these arteries that supply the brain with blood. Allow one hour for this exam. There are no restrictions or special instructions.    Your physician has requested that you have a renal artery duplex. During this test, an ultrasound is used to evaluate blood flow to the kidneys. Allow one hour for this exam. Do not eat after midnight the day before and avoid carbonated beverages. Take your medications as you usually do.   Your provider has ordered a heart monitor to wear for 14 days. This will be mailed to your home with instructions on placement. Once you have finished the time frame  requested, you will return monitor in box provided.     Follow-Up: At [facility], you and your health needs are our priority.  As part of our continuing mission to provide you with exceptional heart care, we have created designated Provider Care Teams.  These Care Teams include your primary Cardiologist (physician) and Advanced Practice Providers (APPs -  Physician Assistants and Nurse Practitioners) who all work together to provide you with the care you need, when you need it.  We recommend signing up for the patient portal called "MyChart".  Sign up information is provided on this After Visit Summary.  MyChart is used to connect with patients for Virtual Visits (Telemedicine).  Patients are able to view lab/test results, encounter notes, upcoming appointments, etc.  Non-urgent messages can be sent to your provider as well.   To learn more about what you can do with MyChart, go to [email or web address].    Your next appointment:   6 week(s)  The format for your next appointment:   In Person  Provider:   You may see Ida Rogue, MD or one of the following Advanced Practice Providers on your designated Care Team:   Murray Hodgkins, NP Christell Faith, PA-C Cadence Kathlen Mody, [place]   Other Instructions N/A  Important Information About Sugar

## 2021-09-02 NOTE — Progress Notes (Signed)
Cardiology Office Note:    Date:  09/02/2021   ID:  CAVIN LONGMAN, DOB 03-20-1945, MRN [id number]  PCP:  McLean-Scocuzza, Nino Glow, MD  [facility] Cardiologist:  Ida Rogue, MD  [facility] Electrophysiologist:  None   Referring MD: McLean-Scocuzza, Olivia Mackie *   Chief Complaint: fatigue  History of Present Illness:    Logan [name] is a 76 y.o. male with a hx of CAD s/ CABG 10/2020, morbid obesity, HFpEF, prior smoker, CKD, mdication noncompliance who presents for follow-up.   The patient was last seen 08/2020 and has not taking cardura or lasix. Only taking metoprolol sometimes. Medication compliance was recommended.   Today, the patient reports falls. Says he has fallen 4 times while walking. He denies chest pain, SOB, or palpitations before the falls. Says he may feel lightheaded and dizzy, but is not sure. He feels leg weakness before the falls. Says he has fallen and hit his head in the past. He didn't want to go to the ER because he didn't want to wait. Also BP is very high today. He says it has been high at home with systolics in the 498Y at times. He stopped Cardura and just retarted it last night. He also took a break form Lopressor at some point. He has LLE for which he takes lasix as needed.   Past Medical History:  Diagnosis Date   CKD (chronic kidney disease), stage III ([place])    Coronary artery disease    s/p cabg triple bypass in 2011    Elevated PSA    Elevated TSH    Enlarged prostate    History of influenza    Hyperlipidemia    Hypertension     Past Surgical History:  Procedure Laterality Date   CORONARY ARTERY BYPASS GRAFT     x 3 vessel   triple bypass  2011    Current Medications: Current Meds  Medication Sig   carvedilol (COREG) 12.5 MG tablet Take 1 tablet (12.5 mg total) by mouth 2 (two) times daily.   clobetasol cream (TEMOVATE) 6.41 % Apply 1 application topically 2 (two) times daily. Lower legs itching and rash   furosemide (LASIX) 20  MG tablet TAKE 1 TABLET BY MOUTH ONCE DAILY ON THE DAYS YOU DO NOT TAKE HCTZ   levothyroxine (EUTHYROX) 75 MCG tablet Take 1 tablet (75 mcg total) by mouth daily before breakfast.   lisinopril (ZESTRIL) 20 MG tablet Take 1 tablet (20 mg total) by mouth 2 (two) times daily.   NAPROXEN PO Take by mouth as needed.   [DISCONTINUED] doxazosin (CARDURA) 1 MG tablet Take 1 tablet (1 mg total) by mouth daily.   [DISCONTINUED] metoprolol tartrate (LOPRESSOR) 25 MG tablet Take 1 tablet by mouth twice daily     Allergies:   Lipitor [atorvastatin calcium]   Social History   Socioeconomic History   Marital status: Married    Spouse name: Not on file   Number of children: Not on file   Years of education: Not on file   Highest education level: Not on file  Occupational History   Not on file  Tobacco Use   Smoking status: Former    Packs/day: 1.50    Years: 30.00    Total pack years: 45.00    Types: Cigarettes   Smokeless tobacco: Never  Vaping Use   Vaping Use: Never used  Substance and Sexual Activity   Alcohol use: No   Drug use: No   Sexual activity: Not Currently  Other Topics Concern   Not on file  Social History Narrative   Married    BS degree chemistry    Kids 12 y.o son    Owns guns, wears seat belt, safe in relationship    Social Determinants of Health   Financial Resource Strain: Not on file  Food Insecurity: Not on file  Transportation Needs: Not on file  Physical Activity: Not on file  Stress: Not on file  Social Connections: Not on file     Family History: The patient's family history includes Diabetes in his grandchild, sister, and son; Heart attack in his maternal grandfather, mother, and paternal grandmother; Heart disease in his father, maternal grandfather, mother, and paternal grandmother; Kidney disease in his sister; Kidney failure in his sister; Stroke in his maternal grandfather, maternal grandmother, and paternal grandfather.  ROS:   Please see the  history of present illness.     All other systems reviewed and are negative.  EKGs/Labs/Other Studies Reviewed:    The following studies were reviewed today:  Echo ordered  EKG:  EKG is  ordered today.  The ekg ordered today demonstrates NSR 69bpm, nonspecific ST/T wave changes  Recent Labs: 03/19/2021: ALT 16; BUN 18; Creatinine, Ser 1.66; Hemoglobin 13.7; Platelets 298.0; Potassium 4.4; Sodium 140 09/02/2021: TSH 2.714  Recent Lipid Panel    Component Value Date/Time   CHOL 189 03/19/2021 0757   TRIG 191.0 (H) 03/19/2021 0757   HDL 36.00 (L) 03/19/2021 0757   CHOLHDL 5 03/19/2021 0757   VLDL 38.2 03/19/2021 0757   LDLCALC 115 (H) 03/19/2021 0757   LDLDIRECT 121.0 09/10/2020 1159     Physical Exam:    VS:  BP (!) 186/113 (BP Location: Left Arm, Patient Position: Sitting, Cuff Size: Large)   Pulse 69   Ht '5\' 10"'$  (1.778 m)   Wt 266 lb (120.7 kg)   SpO2 98%   BMI 38.17 kg/m     Wt Readings from Last 3 Encounters:  09/02/21 266 lb (120.7 kg)  03/17/21 279 lb 1.6 oz (126.6 kg)  09/10/20 267 lb 6.4 oz (121.3 kg)     GEN:  Well nourished, well developed in no acute distress HEENT: Normal NECK: No JVD; No carotid bruits LYMPHATICS: No lymphadenopathy CARDIAC: RRR, no murmurs, rubs, gallops RESPIRATORY:  Clear to auscultation without rales, wheezing or rhonchi  ABDOMEN: Soft, non-tender, non-distended MUSCULOSKELETAL:  lower left leg edema; No deformity  SKIN: Warm and dry NEUROLOGIC:  Alert and oriented x 3 PSYCHIATRIC:  Normal affect   ASSESSMENT:    1. Pre-syncope   2. Dizziness   3. Essential hypertension   4. Fall, sequela   5. Morbid obesity ([place])   6. Coronary artery disease of native artery of native heart with stable angina pectoris ([place])   7. Hyperlipidemia LDL goal <70    PLAN:    In order of problems listed above:  Fall Leg weakness ?presyncope Uncler etiology of the fall. Suspect leg weakness and deconditioning may contribute. He may have  dizziness and lightheadedness before the fall and says he has blacked out in the past. Orthostatics negative. I will order a 2 week heart monitor and an echocardiogram. I will also get a carotid US. I recommended he continue activity as tolerated.   CAD s/p remote CABG No anginal symptoms reported. Continue BB therapy. H/o myalgia with Lipitor. Can discussed ASA at follow-up.   Difficult to control HTN BP elevated today and at home with systolics up to 253G at home.  The patient has history of noncompliance with medications. I will order a renal US. I will increase Cardura to '2mg'$  daily. I will stop Lopressor and start Coreg 12.'5mg'$ BID. Continue lisinopril. We will re-assess BP at follow-up. Low salt diet was encouraged.   Obesity Activity as tolerated was encouraged.   HLD LDL 115. Muscle pain on Lipitor. Can discuss at follow-up.   Leg edema Left leg edema on exam today, he has not had lasix. He generally takes lasix as needed for swelling. I wil check an echo as above.   Disposition: Follow up in 6 week(s) with MD/APP    Signed, Effie Janoski Ninfa Meeker, PA-C  09/02/2021 12:18 PM    [facility]

## 2021-09-04 ENCOUNTER — Telehealth: Payer: Self-pay | Admitting: Cardiovascular Disease

## 2021-09-04 ENCOUNTER — Ambulatory Visit (INDEPENDENT_AMBULATORY_CARE_PROVIDER_SITE_OTHER): Payer: Medicare PPO

## 2021-09-04 DIAGNOSIS — R55 Syncope and collapse: Secondary | ICD-10-CM

## 2021-09-04 DIAGNOSIS — R42 Dizziness and giddiness: Secondary | ICD-10-CM

## 2021-09-04 DIAGNOSIS — I10 Essential (primary) hypertension: Secondary | ICD-10-CM | POA: Diagnosis not present

## 2021-09-04 LAB — ECHOCARDIOGRAM COMPLETE
AR max vel: 2.88 cm2
AV Area VTI: 3.04 cm2
AV Area mean vel: 2.78 cm2
AV Mean grad: 5 mmHg
AV Peak grad: 9.7 mmHg
Ao pk vel: 1.56 m/s
Area-P 1/2: 2.63 cm2
S' Lateral: 4 cm

## 2021-09-04 MED ORDER — PERFLUTREN LIPID MICROSPHERE
1.0000 mL | INTRAVENOUS | Status: AC | PRN
  Administered 2021-09-04: 2 mL via INTRAVENOUS

## 2021-09-04 NOTE — Telephone Encounter (Signed)
Patient wife in office with patient for testing  States that on Wednesday patient had what she believes to be a TIA incident  Would like to discuss and to see if we need to test him for anything else Please call to discuss

## 2021-09-04 NOTE — Telephone Encounter (Signed)
Returned the call to the pt wife. Lmtcb.

## 2021-09-06 DIAGNOSIS — R42 Dizziness and giddiness: Secondary | ICD-10-CM | POA: Diagnosis not present

## 2021-09-06 DIAGNOSIS — R55 Syncope and collapse: Secondary | ICD-10-CM | POA: Diagnosis not present

## 2021-09-08 ENCOUNTER — Telehealth: Payer: Self-pay | Admitting: Cardiovascular Disease

## 2021-09-08 NOTE — Telephone Encounter (Addendum)
Spoke with the patient. Pt made aware of echo, carotid, renal artery dopp results. Patient sts that after taking Cardura 2 mg for 2 days he is unable to tolerate the increased dosage.  He normally take his Cardura at night. He has awaken feeling groggy and out of it. The last 2 nights he has taken his previous dosage of 1 mg and feels fine when he awakes.  He has a couple of 1 mg tabs on hand is requesting a refill of the 1 mg. Adv the pt that I will need to send the update to Cadence [place], [place]. In the meantime he can cut his 2 mg tabs in half and take 1/2 tab daily.  Adv the pt that I will call back with CF recommendation. Pt verbalized understanding.

## 2021-09-08 NOTE — Telephone Encounter (Signed)
Pt c/o medication issue:  1. Name of Medication:   doxazosin (CARDURA) 2 MG tablet  2. How are you currently taking this medication (dosage and times per day)?   As prescribed  3. Are you having a reaction (difficulty breathing--STAT)? Patient stated he feels groggy  4. What is your medication issue?   Patient stated he feels this medication is too strong and wants to go back to taking the '1mg'$  tablets. Patient stated he had some '1mg'$  tablets from previous prescription but he is almost out and would like the new prescription called in to his [place], [place].

## 2021-09-08 NOTE — Telephone Encounter (Signed)
Spoke with pt. See 09/08/21 telephone encounter.

## 2021-09-11 MED ORDER — DOXAZOSIN MESYLATE 1 MG PO TABS: 1.0000 mg | ORAL_TABLET | Freq: Every day | ORAL | 2 refills | Status: DC

## 2021-09-11 NOTE — Telephone Encounter (Signed)
Furth, Cadence H, PA-C      Ok to go back down to 1 mg cardura. Can we ask what his Bps have been at home?      Spoke with the patient and made him aware of Cadence Furth, [place] response   Pt sts he checks his BP multiple times a day. Pt would not provide any specific BP readings. He went on to state, that the medical professionals prescribing these BP medications don't know what they are doing and need to pay closer attention to dosages. Pt went on about this topic for 10 min. Tried to redirect the pt several times unsuccessfully and have him provide his BP readings. I asked the pt to keep his upcoming appts to discuss his concerns with his providers and to continue to keep an eye on his bp.

## 2021-09-14 ENCOUNTER — Telehealth: Payer: Self-pay | Admitting: Cardiovascular Disease

## 2021-09-14 NOTE — Telephone Encounter (Signed)
Pt c/o BP issue: STAT if pt c/o blurred vision, one-sided weakness or slurred speech  1. What are your last 5 BP readings?  182/94  2. Are you having any other symptoms (ex. Dizziness, headache, blurred vision, passed out)?  No   3. What is your BP issue?   Patient states new BP medication is not helping and BP is still elevated

## 2021-09-15 ENCOUNTER — Ambulatory Visit: Payer: Medicare PPO | Admitting: Internal Medicine

## 2021-09-18 NOTE — Telephone Encounter (Signed)
Furth, Cadence H, PA-C  You 1 hour ago (10:25 AM)    We can increase lisinopril to '40mg'$  daily   Will route back to Cadence for clarification. Pt is already taking lisinopril 20 mg bid.

## 2021-09-21 NOTE — Telephone Encounter (Signed)
Furth, Cadence H, PA-C  You 2 days ago    I'm confused, he's taking '20mg'$ , so we can increase it to '40mg'$ .      Pt is already taking lisinopril 20 mg bid.

## 2021-09-24 DIAGNOSIS — R42 Dizziness and giddiness: Secondary | ICD-10-CM | POA: Diagnosis not present

## 2021-09-24 DIAGNOSIS — R55 Syncope and collapse: Secondary | ICD-10-CM | POA: Diagnosis not present

## 2021-09-29 ENCOUNTER — Encounter: Payer: Self-pay | Admitting: Internal Medicine

## 2021-09-29 ENCOUNTER — Ambulatory Visit: Payer: Medicare PPO | Admitting: Internal Medicine

## 2021-09-29 ENCOUNTER — Ambulatory Visit
Admission: RE | Admit: 2021-09-29 | Discharge: 2021-09-29 | Disposition: A | Discharge status: Home / Self Care | Payer: Medicare PPO | Source: Ambulatory Visit | Attending: Internal Medicine | Admitting: Internal Medicine

## 2021-09-29 VITALS — BP 132/80 | HR 62 | Temp 97.8°F | Ht 70.0 in | Wt 266.0 lb

## 2021-09-29 DIAGNOSIS — E039 Hypothyroidism, unspecified: Secondary | ICD-10-CM | POA: Diagnosis not present

## 2021-09-29 DIAGNOSIS — R4701 Aphasia: Secondary | ICD-10-CM

## 2021-09-29 DIAGNOSIS — R7303 Prediabetes: Secondary | ICD-10-CM

## 2021-09-29 DIAGNOSIS — I10 Essential (primary) hypertension: Secondary | ICD-10-CM | POA: Diagnosis not present

## 2021-09-29 DIAGNOSIS — R42 Dizziness and giddiness: Secondary | ICD-10-CM

## 2021-09-29 DIAGNOSIS — R55 Syncope and collapse: Secondary | ICD-10-CM

## 2021-09-29 DIAGNOSIS — W19XXXA Unspecified fall, initial encounter: Secondary | ICD-10-CM | POA: Insufficient documentation

## 2021-09-29 DIAGNOSIS — N1832 Chronic kidney disease, stage 3b: Secondary | ICD-10-CM

## 2021-09-29 DIAGNOSIS — G319 Degenerative disease of nervous system, unspecified: Secondary | ICD-10-CM | POA: Diagnosis not present

## 2021-09-29 DIAGNOSIS — I639 Cerebral infarction, unspecified: Secondary | ICD-10-CM | POA: Insufficient documentation

## 2021-09-29 LAB — CBC WITH DIFFERENTIAL/PLATELET
Basophils Absolute: 0.1 10*3/uL (ref 0.0–0.1)
Basophils Relative: 0.9 % (ref 0.0–3.0)
Eosinophils Absolute: 0.2 10*3/uL (ref 0.0–0.7)
Eosinophils Relative: 2.5 % (ref 0.0–5.0)
HCT: 40.5 % (ref 39.0–52.0)
Hemoglobin: 13.3 g/dL (ref 13.0–17.0)
Lymphocytes Relative: 12.9 % (ref 12.0–46.0)
Lymphs Abs: 1.3 10*3/uL (ref 0.7–4.0)
MCHC: 32.7 g/dL (ref 30.0–36.0)
MCV: 87.7 fl (ref 78.0–100.0)
Monocytes Absolute: 0.6 10*3/uL (ref 0.1–1.0)
Monocytes Relative: 6.5 % (ref 3.0–12.0)
Neutro Abs: 7.5 10*3/uL (ref 1.4–7.7)
Neutrophils Relative %: 77.2 % — ABNORMAL HIGH (ref 43.0–77.0)
Platelets: 261 10*3/uL (ref 150.0–400.0)
RBC: 4.62 Mil/uL (ref 4.22–5.81)
RDW: 14.5 % (ref 11.5–15.5)
WBC: 9.7 10*3/uL (ref 4.0–10.5)

## 2021-09-29 LAB — COMPREHENSIVE METABOLIC PANEL
ALT: 17 U/L (ref 0–53)
AST: 14 U/L (ref 0–37)
Albumin: 4 g/dL (ref 3.5–5.2)
Alkaline Phosphatase: 109 U/L (ref 39–117)
BUN: 21 mg/dL (ref 6–23)
CO2: 28 mEq/L (ref 19–32)
Calcium: 9.5 mg/dL (ref 8.4–10.5)
Chloride: 104 mEq/L (ref 96–112)
Creatinine, Ser: 1.63 mg/dL — ABNORMAL HIGH (ref 0.40–1.50)
GFR: 40.57 mL/min — ABNORMAL LOW (ref >60.00)
Glucose, Bld: 95 mg/dL (ref 70–99)
Potassium: 4.1 mEq/L (ref 3.5–5.1)
Sodium: 138 mEq/L (ref 135–145)
Total Bilirubin: 0.9 mg/dL (ref 0.2–1.2)
Total Protein: 7.2 g/dL (ref 6.0–8.3)

## 2021-09-29 LAB — HEMOGLOBIN A1C: Hgb A1c MFr Bld: 5.8 % (ref 4.6–6.5)

## 2021-09-29 MED ORDER — LISINOPRIL 20 MG PO TABS: 20.0000 mg | ORAL_TABLET | Freq: Two times a day (BID) | ORAL | 3 refills | Status: DC

## 2021-09-29 MED ORDER — CARVEDILOL 25 MG PO TABS
25.0000 mg | ORAL_TABLET | Freq: Two times a day (BID) | ORAL | 3 refills | Status: DC
Start: 2021-09-29 — End: 2021-09-29

## 2021-09-29 MED ORDER — CARVEDILOL 25 MG PO TABS
25.0000 mg | ORAL_TABLET | Freq: Two times a day (BID) | ORAL | 3 refills | Status: DC
Start: 2021-09-29 — End: 2022-08-22

## 2021-09-29 MED ORDER — LEVOTHYROXINE SODIUM 75 MCG PO TABS: 75.0000 ug | ORAL_TABLET | Freq: Every day | ORAL | 3 refills | Status: DC

## 2021-09-29 NOTE — Patient Instructions (Signed)
Syncope, Adult  Syncope refers to a condition in which a person temporarily loses consciousness. Syncope may also be called fainting or passing out. It is caused by a sudden decrease in blood flow to the brain. This can happen for a variety of reasons. Most causes of syncope are not dangerous. It can be triggered by things such as needle sticks, seeing blood, pain, or intense emotion. However, syncope can also be a sign of a serious medical problem, such as a heart abnormality. Other causes can include dehydration, migraines, or taking medicines that lower blood pressure. Your health care provider may do tests to find the reason why you are having syncope. If you faint, get medical help right away. Call your local emergency services (911 in the [place] Follow these instructions at home: Pay attention to any changes in your symptoms. Take these actions to stay safe and to help relieve your symptoms: Knowing when you may be about to faint Signs that you may be about to faint include: Feeling dizzy, weak, light-headed, or like the room is spinning. Feeling nauseous. Seeing spots or seeing all white or all black in your field of vision. Having cold, clammy skin or feeling warm and sweaty. Hearing ringing in the ears (tinnitus). If you start to feel like you might faint, sit or lie down right away. If sitting, put your head down between your legs. If lying down, raise (elevate) your feet above the level of your heart. Breathe deeply and steadily. Wait until all the symptoms have passed. Have someone stay with you until you feel stable. Medicines Take over-the-counter and prescription medicines only as told by your health care provider. If you are taking blood pressure or heart medicine, get up slowly and take several minutes to sit and then stand. This can reduce dizziness and decrease the risk of syncope. Lifestyle Do not drive, use machinery, or play sports until your health care provider says it is  okay. Do not drink alcohol. Do not use any products that contain nicotine or tobacco. These products include cigarettes, chewing tobacco, and vaping devices, such as e-cigarettes. If you need help quitting, ask your health care provider. Avoid hot tubs and saunas. General instructions Talk with your health care provider about your symptoms. You may need to have testing to understand the cause of your syncope. Drink enough fluid to keep your urine pale yellow. Avoid prolonged standing. If you must stand for a long time, do movements such as: Moving your legs. Crossing your legs. Flexing and stretching your leg muscles. Squatting. Keep all follow-up visits. This is important. Contact a health care provider if: You have episodes of near fainting. Get help right away if: You faint. You hit your head or are injured after fainting. You have any of these symptoms that may indicate trouble with your heart: Fast or irregular heartbeats (palpitations). Unusual pain in your chest, abdomen, or back. Shortness of breath. You have a seizure. You have a severe headache. You are confused. You have vision problems. You have severe weakness or trouble walking. You are bleeding from your mouth or rectum, or you have black or tarry stool. These symptoms may represent a serious problem that is an emergency. Do not wait to see if your symptoms will go away. Get medical help right away. Call your local emergency services (911 in the [place] Do not drive yourself to the hospital. Summary Syncope refers to a condition in which a person temporarily loses consciousness. Syncope may also be called   fainting or passing out. It is caused by a sudden decrease in blood flow to the brain. Signs that you may be about to faint include dizziness, feeling light-headed, feeling nauseous, sudden vision changes, or cold, clammy skin. Even though most causes of syncope are not dangerous, syncope can be a sign of a serious  medical problem. Get help right away if you faint. If you start to feel like you might faint, sit or lie down right away. If sitting, put your head down between your legs. If lying down, raise (elevate) your feet above the level of your heart. This information is not intended to replace advice given to you by your health care provider. Make sure you discuss any questions you have with your health care provider. Document Revised: 06/05/2020 Document Reviewed: 06/05/2020 Elsevier Patient Education  2023 Elsevier Inc.  

## 2021-09-29 NOTE — Addendum Note (Signed)
Addended by: Orland Mustard on: 09/29/2021 04:46 PM   Modules accepted: Orders

## 2021-09-29 NOTE — Progress Notes (Signed)
Chief Complaint  Patient presents with   Follow-up    6 month f/u pt would also like to discuss increasing his carvediol he is already started increasing it by himself and would like to be px '25mg'$     F/u with wife Elmo Putt 1. Presyncope/syncope x 3 in 08/2021 and 2 other times since with dizziness total 5 falls and 1 time 08/2021 lost ability to speak and BP was 200s/100s. Denies bladder/bowel incontinence and LOC ? If he had this he does have right shoulder and hip pain declines ortho referral for now  2. Htn on cardura 1 mg qhs increased to 2 mg qhs did not work, cards change metoprolol to coreg 12.5 mg bid but BP still high and he increased to 25 mg bid which is helping on lasix 20 mg bid and lisinopril 20 mg bid     Review of Systems  Constitutional:  Negative for weight loss.  HENT:  Negative for hearing loss.   Eyes:  Negative for blurred vision.  Respiratory:  Negative for shortness of breath.   Cardiovascular:  Negative for chest pain.  Gastrointestinal:  Negative for abdominal pain and blood in stool.  Musculoskeletal:  Positive for falls and joint pain. Negative for back pain.  Skin:  Negative for rash.  Neurological:  Positive for speech change and loss of consciousness. Negative for focal weakness, seizures, weakness and headaches.  Psychiatric/Behavioral:  Negative for depression.    Past Medical History:  Diagnosis Date   CKD (chronic kidney disease), stage III ([place])    Coronary artery disease    s/p cabg triple bypass in 2011    Elevated PSA    Elevated TSH    Enlarged prostate    History of influenza    Hyperlipidemia    Hypertension    Past Surgical History:  Procedure Laterality Date   CORONARY ARTERY BYPASS GRAFT     x 3 vessel   triple bypass  2011   Family History  Problem Relation Age of Onset   Heart attack Mother    Heart disease Mother    Heart disease Father    Diabetes Sister        type 2    Kidney disease Sister    Kidney failure Sister         stage 5 in 55s as of 09/2020   Stroke Maternal Grandmother    Heart attack Maternal Grandfather    Stroke Maternal Grandfather    Heart disease Maternal Grandfather    Heart attack Paternal Grandmother    Heart disease Paternal Grandmother    Stroke Paternal Grandfather    Diabetes Son        type 1   Diabetes Grandchild        type 2   Social History   Socioeconomic History   Marital status: Married    Spouse name: Not on file   Number of children: Not on file   Years of education: Not on file   Highest education level: Not on file  Occupational History   Not on file  Tobacco Use   Smoking status: Former    Packs/day: 1.50    Years: 30.00    Total pack years: 45.00    Types: Cigarettes   Smokeless tobacco: Never  Vaping Use   Vaping Use: Never used  Substance and Sexual Activity   Alcohol use: No   Drug use: No   Sexual activity: Not Currently  Other Topics Concern  Not on file  Social History Narrative   Married    BS degree chemistry    Kids 99 y.o son    Owns guns, wears seat belt, safe in relationship    Social Determinants of Health   Financial Resource Strain: Not on file  Food Insecurity: Not on file  Transportation Needs: Not on file  Physical Activity: Not on file  Stress: Not on file  Social Connections: Not on file  Intimate Partner Violence: Not on file   Current Meds  Medication Sig   doxazosin (CARDURA) 1 MG tablet Take 1 tablet (1 mg total) by mouth daily.   furosemide (LASIX) 20 MG tablet TAKE 1 TABLET BY MOUTH ONCE DAILY ON THE DAYS YOU DO NOT TAKE HCTZ (Patient taking differently: Take 20 mg by mouth 2 (two) times daily.)   NAPROXEN PO Take by mouth as needed.   [DISCONTINUED] carvedilol (COREG) 12.5 MG tablet Take 1 tablet (12.5 mg total) by mouth 2 (two) times daily.   [DISCONTINUED] levothyroxine (EUTHYROX) 75 MCG tablet Take 1 tablet (75 mcg total) by mouth daily before breakfast.   [DISCONTINUED] lisinopril (ZESTRIL) 20 MG tablet  Take 1 tablet (20 mg total) by mouth 2 (two) times daily.   Allergies  Allergen Reactions   Lipitor [Atorvastatin Calcium]     Muscle aches   Recent Results (from the past 2160 hour(s))  TSH     Status: None   Collection Time: 09/02/21 11:26 AM  Result Value Ref Range   TSH 2.714 0.350 - 4.500 uIU/mL    Comment: Performed by a 3rd Generation assay with a functional sensitivity of <=0.01 uIU/mL. Performed at [facility], [address] [place], [place] [address]   ECHOCARDIOGRAM COMPLETE     Status: None   Collection Time: 09/04/21 11:30 AM  Result Value Ref Range   AR max vel 2.88 cm2   AV Peak grad 9.7 mmHg   Ao pk vel 1.56 m/s   S' Lateral 4.00 cm   Area-P 1/2 2.63 cm2   AV Area VTI 3.04 cm2   AV Mean grad 5.0 mmHg   AV Area mean vel 2.78 cm2   Objective  Body mass index is 38.17 kg/m. Wt Readings from Last 3 Encounters:  09/29/21 266 lb (120.7 kg)  09/02/21 266 lb (120.7 kg)  03/17/21 279 lb 1.6 oz (126.6 kg)   Temp Readings from Last 3 Encounters:  09/29/21 97.8 F (36.6 C) (Oral)  03/17/21 98.8 F (37.1 C) (Oral)  09/10/20 (!) 96 F (35.6 C) (Temporal)   BP Readings from Last 3 Encounters:  09/29/21 132/80  09/02/21 (!) 186/113  03/17/21 120/82   Pulse Readings from Last 3 Encounters:  09/29/21 62  09/02/21 69  03/17/21 79    Physical Exam Vitals and nursing note reviewed.  Constitutional:      Appearance: Normal appearance. He is well-developed and well-groomed.  HENT:     Head: Normocephalic and atraumatic.  Eyes:     Conjunctiva/sclera: Conjunctivae normal.     Pupils: Pupils are equal, round, and reactive to light.  Cardiovascular:     Rate and Rhythm: Normal rate and regular rhythm.     Heart sounds: Normal heart sounds.  Pulmonary:     Effort: Pulmonary effort is normal. No respiratory distress.     Breath sounds: Normal breath sounds.  Abdominal:     Tenderness: There is no abdominal tenderness.  Skin:    General: Skin is  warm and moist.  Neurological:     General: No focal deficit present.     Mental Status: He is alert and oriented to person, place, and time. Mental status is at baseline.     Sensory: Sensation is intact.     Motor: Motor function is intact.     Coordination: Coordination is intact.     Gait: Gait is intact. Gait normal.  Psychiatric:        Attention and Perception: Attention and perception normal.        Mood and Affect: Mood and affect normal.        Speech: Speech normal.        Behavior: Behavior normal. Behavior is cooperative.        Thought Content: Thought content normal.        Cognition and Memory: Cognition and memory normal.        Judgment: Judgment normal.     Assessment  Plan  Aphasia - Plan: MR Brain Wo Contrast  Syncope, unspecified syncope type - Plan: Comprehensive metabolic panel, CBC w/Diff, MR Brain Wo Contrast, MRI brain r/o stroke  Fall, initial encounter x 5 since 08/2021 with possibly LOC- Plan: MR Brain Wo Contrast,  Echo normal  Holter normal  US renal vascular duplex normal  Cards standpoint neg w/u  BP could have been elevated which precipitated fall but need to r/o STROKE/TIA also in Ddx or hypertensive encephalopathy/urgency/emergency   Dizziness - Plan: Comprehensive metabolic panel, CBC w/Diff, MR Brain Wo Contrast See above   Primary hypertension improved  on cardura 1 mg qhs increased to 2 mg qhs did not work, cards change metoprolol to coreg 12.5 mg bid but BP still high and he increased to 25 mg bid which is helping on lasix 20 mg bid and lisinopril 20 mg bid    Stage 3b chronic kidney disease ([place]) - Plan: Comprehensive metabolic panel  Prediabetes - Plan: Hemoglobin A1c  Hypothyroidism, unspecified type - Plan: levothyroxine (EUTHYROX) 75 MCG tablet   HM Flu shot due had flu in 2006 and was very sick sch 10 or 12/2020    rec get Tdap rx   [employer] 4/4 11/2020    Will disc shingrix in future consider  Consider prevnar disc  03/17/21 declines, pna 23 decline for now hep C neg    PSA elevated consider urology in future denied in past declines prostate MRI 11/02/19 he does not want to know if he has prostate cancer and this is why     Ref. Range 11/05/2019 09:11  PSA Latest Ref Range: 0.10 - 4.00 ng/mL 7.74 (H)      Denies colonoscopy  -declines cologuard   CT chest consider former smoker [id number] 1-2 ppd no FH lung cancer reviewed to consider prev discussed     Provider: Dr. Olivia Mackie McLean-Scocuzza-[facility] Medicine

## 2021-09-30 ENCOUNTER — Telehealth: Payer: Self-pay | Admitting: Internal Medicine

## 2021-09-30 NOTE — Telephone Encounter (Signed)
Patient's wife called and would like a call from Dr Olivia Mackie about patient's MRI. Please call her at [phone]. She is not sure if she got all the information from her husband.

## 2021-09-30 NOTE — Telephone Encounter (Signed)
FYI  Pt had stroke left frontal new and chronic lacunar both sides  BP improved but could have been due to HTN  BP is still not controlled can you all schedule visit to address?  I discussed ARB yesterday at visit I.e valsartan or micardis or olmesartan  He has appt in 2 weeks   EXAM: MRI HEAD WITHOUT CONTRAST   TECHNIQUE: Multiplanar, multiecho pulse sequences of the brain and surrounding structures were obtained without intravenous contrast.   COMPARISON:  None Available.   FINDINGS: Brain: There is a 4 mm focus of trace diffusion weighted signal hyperintensity in normal ADC in the subcortical white matter of the high posterior left frontal lobe potentially reflecting a subacute infarct. No definite acute infarct, mass, midline shift, or extra-axial fluid collection is identified. Patchy T2 hyperintensities in the cerebral white matter bilaterally are nonspecific but compatible with moderate chronic small vessel ischemic disease. Mild chronic small vessel changes are also present in the pons. There are chronic lacunar infarcts in the cerebral white matter and basal ganglia regions bilaterally. There are a few scattered nonspecific chronic microhemorrhages located peripherally in both cerebral hemispheres. There is mild cerebral atrophy.   Vascular: Major intracranial vascular flow voids are preserved.   Skull and upper cervical spine: Unremarkable bone marrow signal para   Sinuses/Orbits: Unremarkable orbits. Minimal mucosal thickening in the maxillary sinuses. Clear mastoid air cells.   Other: None.   IMPRESSION: 1. Possible small subacute subcortical white matter infarct in the left frontal lobe. 2. Moderate chronic small vessel ischemic disease.     Electronically Signed   By: Logan Bores M.D.   On: 09/29/2021 14:30

## 2021-10-01 ENCOUNTER — Telehealth: Payer: Self-pay

## 2021-10-01 NOTE — Telephone Encounter (Signed)
LMOM for pt CB to discuss labs   McLean-Scocuzza, Nino Glow, MD  Gracy Racer, CMA Blood cts normal  Chronic kidney disease stable  Does pt want to see kidney doctor?  +prediabetes

## 2021-10-01 NOTE — Telephone Encounter (Signed)
Pt returning call and pt does not want to see the kidney doctor

## 2021-10-06 ENCOUNTER — Telehealth: Payer: Self-pay

## 2021-10-06 NOTE — Telephone Encounter (Signed)
-----   Message from Garvin Fila, MD sent at 10/01/2021  8:24 AM EDT ----- Sure. It seems we have not seen him. Will ask our nurse to schedule as new consult ----- Message ----- From: McLean-Scocuzza, Nino Glow, MD Sent: 09/29/2021   4:41 PM EDT To: Garvin Fila, MD  Referring this patient to you  Can you all work him in to be seen?

## 2021-10-06 NOTE — Telephone Encounter (Signed)
Pt has been scheduled for 10/13/2021.

## 2021-10-13 ENCOUNTER — Ambulatory Visit: Payer: Medicare PPO | Admitting: Neurology

## 2021-10-15 ENCOUNTER — Ambulatory Visit: Payer: Medicare PPO | Attending: Medical | Admitting: Medical

## 2021-10-15 ENCOUNTER — Encounter: Payer: Self-pay | Admitting: Medical

## 2021-10-15 VITALS — BP 174/98 | HR 63

## 2021-10-15 DIAGNOSIS — I5032 Chronic diastolic (congestive) heart failure: Secondary | ICD-10-CM

## 2021-10-15 DIAGNOSIS — I2510 Atherosclerotic heart disease of native coronary artery without angina pectoris: Secondary | ICD-10-CM

## 2021-10-15 DIAGNOSIS — E782 Mixed hyperlipidemia: Secondary | ICD-10-CM | POA: Diagnosis not present

## 2021-10-15 DIAGNOSIS — I639 Cerebral infarction, unspecified: Secondary | ICD-10-CM

## 2021-10-15 DIAGNOSIS — R55 Syncope and collapse: Secondary | ICD-10-CM

## 2021-10-15 DIAGNOSIS — I10 Essential (primary) hypertension: Secondary | ICD-10-CM | POA: Diagnosis not present

## 2021-10-15 NOTE — Patient Instructions (Signed)
Medication Instructions:   Your physician recommends that you continue on your current medications as directed. Please refer to the Current Medication list given to you today.  *If you need a refill on your cardiac medications before your next appointment, please call your pharmacy*   Lab Work:  None Ordered  If you have labs (blood work) drawn today and your tests are completely normal, you will receive your results only by: [place] (if you have MyChart) OR A paper copy in the mail If you have any lab test that is abnormal or we need to change your treatment, we will call you to review the results.   Testing/Procedures:  None Ordered   Follow-Up: At [facility], you and your health needs are our priority.  As part of our continuing mission to provide you with exceptional heart care, we have created designated Provider Care Teams.  These Care Teams include your primary Cardiologist (physician) and Advanced Practice Providers (APPs -  Physician Assistants and Nurse Practitioners) who all work together to provide you with the care you need, when you need it.  We recommend signing up for the patient portal called "MyChart".  Sign up information is provided on this After Visit Summary.  MyChart is used to connect with patients for Virtual Visits (Telemedicine).  Patients are able to view lab/test results, encounter notes, upcoming appointments, etc.  Non-urgent messages can be sent to your provider as well.   To learn more about what you can do with MyChart, go to [email or web address].    Your next appointment:  October 10th, 2023 @ 2:20pm  The format for your next appointment:   In Person  Provider:   Ida Rogue, MD   Other Instructions  If you could do some research on Imdur and Hydralazine.   Important Information About Sugar

## 2021-10-15 NOTE — Progress Notes (Signed)
Cardiology Office Note:    Date:  10/15/2021   ID:  Logan [name], DOB 03-Jan-1946, MRN [id number]  PCP:  McLean-Scocuzza, Nino Glow, MD  [facility] Cardiologist:  Ida Rogue, MD  [facility] Electrophysiologist:  None   Referring MD: McLean-Scocuzza, Olivia Mackie *   Chief Complaint: 2 month follow-up  History of Present Illness:    Logan [name] is a 76 y.o. male with a hx of CAD s/p CABG 10/2020, morbid obesity, HFpEF, prior smoker, CKD, medication noncompliance, difficult to control HTN who presents for follow-up.    The patient seen 08/2020 and has not taking cardura or lasix. He was only taking metoprolol sometimes. Medication compliance was recommended.   Last seen 09/02/21 and reported 4 falls while walking. He did not want to go to the ER because he didn't want to have to wait. BP was high with systolics in the 409W. Orthostatics were negative. A 2 week heart monitor, echo and b/l carotid US were ordered. Cardura was increased. Lopressor was stopped and he was started on Coreg.   Echo showed LVEF 55-60%, no WMA, G1DD, borderline dilation of the ascending aorta. Carotid US showed no thrombus, dissection, or plaque. Renal US showed no RAS. Heart monitor showed normal rhythm, 2 runs of SVT longest 6 beats, rare ectopy.   The patient noted aphasia and told his PCP. MRI showed possible stroke, small subacute subcortical white matter infarct in the left frontal lobe. He was made a follow-up with neurology, but canceled it.   Today, BP is high but he has not had his second dose of medications.BP at home have been 175/80. The patient is taking Coreg '25mg'$  daily, lisinopril '40mg'$  daily and cardura '1mg'$  daily. He did try taking cardura BID, but this caused some grogginess the next day. He may try this again however. We had a long discussion about blood pressure. He is in agreement he needs another BP medication. We discussed Imdur and Hydralazine, he would like to read about these first.  Patient takes lasix '20mg'$  daily, and an extra one as needed. No LLE, orthopnea or pnd.   Past Medical History:  Diagnosis Date   CKD (chronic kidney disease), stage III ([place])    Coronary artery disease    s/p cabg triple bypass in 2011    Elevated PSA    Elevated TSH    Enlarged prostate    History of influenza    Hyperlipidemia    Hypertension    Stroke [facility])     Past Surgical History:  Procedure Laterality Date   CORONARY ARTERY BYPASS GRAFT     x 3 vessel   triple bypass  2011    Current Medications: Current Meds  Medication Sig   carvedilol (COREG) 25 MG tablet Take 1 tablet (25 mg total) by mouth 2 (two) times daily with a meal. Dc 12.5 mg bid   doxazosin (CARDURA) 1 MG tablet Take 1 tablet (1 mg total) by mouth daily.   furosemide (LASIX) 20 MG tablet TAKE 1 TABLET BY MOUTH ONCE DAILY ON THE DAYS YOU DO NOT TAKE HCTZ   levothyroxine (EUTHYROX) 75 MCG tablet Take 1 tablet (75 mcg total) by mouth daily before breakfast.   lisinopril (ZESTRIL) 20 MG tablet Take 1 tablet (20 mg total) by mouth 2 (two) times daily.   NAPROXEN PO Take by mouth as needed.     Allergies:   Lipitor [atorvastatin calcium]   Social History   Socioeconomic History   Marital status: Married  Spouse name: Not on file   Number of children: Not on file   Years of education: Not on file   Highest education level: Not on file  Occupational History   Not on file  Tobacco Use   Smoking status: Former    Packs/day: 1.50    Years: 30.00    Total pack years: 45.00    Types: Cigarettes   Smokeless tobacco: Never  Vaping Use   Vaping Use: Never used  Substance and Sexual Activity   Alcohol use: No   Drug use: No   Sexual activity: Not Currently  Other Topics Concern   Not on file  Social History Narrative   Married    BS degree chemistry    Kids 77 y.o son    Owns guns, wears seat belt, safe in relationship    Social Determinants of Health   Financial Resource Strain: Not on file   Food Insecurity: Not on file  Transportation Needs: Not on file  Physical Activity: Not on file  Stress: Not on file  Social Connections: Not on file     Family History: The patient's family history includes Diabetes in his grandchild, sister, and son; Heart attack in his maternal grandfather, mother, and paternal grandmother; Heart disease in his father, maternal grandfather, mother, and paternal grandmother; Kidney disease in his sister; Kidney failure in his sister; Stroke in his maternal grandfather, maternal grandmother, and paternal grandfather.  ROS:   Please see the history of present illness.     All other systems reviewed and are negative.  EKGs/Labs/Other Studies Reviewed:    The following studies were reviewed today:  Heart monitor 09/2021   Normal sinus rhythm Patient had a min HR of 46 bpm, max HR of 138 bpm, and avg HR of 63 bpm.    3 Supraventricular Tachycardia runs occurred, the run with the fastest interval lasting 5 beats with a max rate of 138 bpm, the longest lasting 6 beats with an avg rate of 100 bpm.    Isolated SVEs were rare (<1.0%), SVE Couplets were rare (<1.0%), and SVE Triplets were rare (<1.0%).  Isolated VEs were rare (<1.0%), VE Couplets were rare (<1.0%), and no VE Triplets were present. Ventricular Trigeminy was present.   Patient triggered event associated with normal sinus rhythm   Signed, [name], MD, Ph.D [facility]  Echo 08/2021 1. Left ventricular ejection fraction, by estimation, is 55 to 60%. The  left ventricle has normal function. The left ventricle has no regional  wall motion abnormalities. The left ventricular internal cavity size was  mildly dilated. Left ventricular  diastolic parameters are consistent with Grade I diastolic dysfunction  (impaired relaxation).   2. Right ventricular systolic function is normal. The right ventricular  size is normal. There is normal pulmonary artery systolic pressure. The  estimated  right ventricular systolic pressure is 61.6 mmHg.   3. The mitral valve is normal in structure. No evidence of mitral valve  regurgitation. No evidence of mitral stenosis.   4. The aortic valve is normal in structure. Aortic valve regurgitation is  not visualized. Aortic valve sclerosis is present, with no evidence of  aortic valve stenosis.   5. There is borderline dilatation of the ascending aorta, measuring 38  mm.   6. The inferior vena cava is normal in size with greater than 50%  respiratory variability, suggesting right atrial pressure of 3 mmHg.   Carotid US 08/2021 Summary:  Right Carotid: There was no evidence of  thrombus, dissection,  atherosclerotic                 plaque or stenosis in the cervical carotid system.   Left Carotid: There was no evidence of thrombus, dissection,  atherosclerotic                plaque or stenosis in the cervical carotid system.                   TDS due to neck girth and deep vessels. Plaque morphology  was                suboptimally visualized.   Vertebrals:  Bilateral vertebral arteries demonstrate antegrade flow.  Subclavians: Normal flow hemodynamics were seen in bilateral subclavian               arteries.   Renal US 08/2021    Right: Normal size right kidney. Normal right Resisitive Index.         Abnormal cortical thickness of right kidney. No evidence of         right renal artery stenosis. RRV flow present. Cyst(s) noted.  Left:  Cyst(s) noted. LRV flow present. No evidence of left renal         artery stenosis. Normal size of left kidney. Normal left         Resistive Index. Abnormal cortical thickness of the left         kidney.  Mesenteric:  Normal Celiac artery and Superior Mesenteric artery findings.   EKG:  EKG is not  ordered today.    Recent Labs: 09/02/2021: TSH 2.714 09/29/2021: ALT 17; BUN 21; Creatinine, Ser 1.63; Hemoglobin 13.3; Platelets 261.0; Potassium 4.1; Sodium 138  Recent Lipid Panel    Component  Value Date/Time   CHOL 189 03/19/2021 0757   TRIG 191.0 (H) 03/19/2021 0757   HDL 36.00 (L) 03/19/2021 0757   CHOLHDL 5 03/19/2021 0757   VLDL 38.2 03/19/2021 0757   LDLCALC 115 (H) 03/19/2021 0757   LDLDIRECT 121.0 09/10/2020 1159     Physical Exam:    VS:  BP (!) 174/98 (BP Location: Left Arm, Patient Position: Sitting, Cuff Size: Large)   Pulse 63     Wt Readings from Last 3 Encounters:  09/29/21 266 lb (120.7 kg)  09/02/21 266 lb (120.7 kg)  03/17/21 279 lb 1.6 oz (126.6 kg)     GEN:  Well nourished, well developed in no acute distress HEENT: Normal NECK: No JVD; No carotid bruits LYMPHATICS: No lymphadenopathy CARDIAC: RRR, no murmurs, rubs, gallops RESPIRATORY:  Clear to auscultation without rales, wheezing or rhonchi  ABDOMEN: Soft, non-tender, non-distended MUSCULOSKELETAL:  No edema; No deformity  SKIN: Warm and dry NEUROLOGIC:  Alert and oriented x 3 PSYCHIATRIC:  Normal affect   ASSESSMENT:    1. Essential hypertension   2. Cerebrovascular accident (CVA), unspecified mechanism ([place])   3. Pre-syncope   4. Coronary artery disease involving native coronary artery of native heart without angina pectoris   5. Hyperlipidemia, mixed   6. Chronic diastolic heart failure (HCC)    PLAN:    In order of problems listed above:  HTN Renal US showed no RAS. Since the last visit, patient had aphasia and was found to have a small stroke. He was referred to neurology, but said he did not want to see them. BP still elevated 174/80, which is what it's been at home. The patient overall is asymptomatic. He is taking Coreg '25mg'$   BID, Lisinopril '40mg'$  daily, and Cardura '1mg'$  daily. He may try Cardura '1mg'$ BID again. He is in agreement he needs another BP medication, we discussed Imdur vs Hydralazine.   Stroke Pre-syncope Patient had aphasia and MRI showed possible small subacute subcortical white matter infarct in the left frontal lobe. Patient canceled visit for neurology. He has a  visit with PCP in 2 weeks. No further neurological symptoms noted by patient. Patient had prior work up for fall/presyncope. Heart monitor in July showing NSR with 3 runs of SVT and rare ectopy. Echo showed normal LVEF 55-60%. Mp WMA, G1DD, borderline dilation of the aorta at 76m. UKoreacarotids showed no stenosis. We are working on BP management as above.   CAD s/p remote CABG No anginal symptoms reported. Continue BB thearpy. H/o myalgia with Lipitor. Would recommend ASA '81mg'$  daily.   HLD LDL 115. H/o myalgias on statins. Can consider PCSK9i.  Diastolic Dysfunction Echo showed normal LVEF with G1DD. He is on lasix '20mg'$  daily. He appears euvolemic on exam. Continue Coreg and Lisinopril.      Disposition: Follow up in 1 month(s) with MD/APP    Signed, Faruq Rosenberger HNinfa Meeker PA-C  10/15/2021 2:52 PM    [facility]

## 2021-10-27 ENCOUNTER — Ambulatory Visit: Payer: Medicare PPO | Admitting: Internal Medicine

## 2021-10-27 ENCOUNTER — Encounter: Payer: Self-pay | Admitting: Internal Medicine

## 2021-10-27 VITALS — BP 136/88 | HR 51 | Temp 98.4°F | Ht 70.0 in | Wt 261.0 lb

## 2021-10-27 DIAGNOSIS — L989 Disorder of the skin and subcutaneous tissue, unspecified: Secondary | ICD-10-CM

## 2021-10-27 DIAGNOSIS — Z1283 Encounter for screening for malignant neoplasm of skin: Secondary | ICD-10-CM

## 2021-10-27 DIAGNOSIS — I10 Essential (primary) hypertension: Secondary | ICD-10-CM

## 2021-10-27 MED ORDER — FUROSEMIDE 20 MG PO TABS: ORAL_TABLET | ORAL | 3 refills | Status: DC

## 2021-10-27 MED ORDER — LOSARTAN POTASSIUM 50 MG PO TABS: 25.0000 mg | ORAL_TABLET | Freq: Two times a day (BID) | ORAL | 0 refills | Status: DC

## 2021-10-27 NOTE — Progress Notes (Unsigned)
Chief Complaint  Patient presents with   Follow-up    4 week f/u and pt needs a new px for Lasix    HPI ROS Past Medical History:  Diagnosis Date   CKD (chronic kidney disease), stage III ([place])    Coronary artery disease    s/p cabg triple bypass in 2011    Elevated PSA    Elevated TSH    Enlarged prostate    History of influenza    Hyperlipidemia    Hypertension    Stroke [facility])    Past Surgical History:  Procedure Laterality Date   CORONARY ARTERY BYPASS GRAFT     x 3 vessel   triple bypass  2011   Family History  Problem Relation Age of Onset   Heart attack Mother    Heart disease Mother    Heart disease Father    Diabetes Sister        type 2    Kidney disease Sister    Kidney failure Sister        stage 24 in 101s as of 09/2020   Stroke Maternal Grandmother    Heart attack Maternal Grandfather    Stroke Maternal Grandfather    Heart disease Maternal Grandfather    Heart attack Paternal Grandmother    Heart disease Paternal Grandmother    Stroke Paternal Grandfather    Diabetes Son        type 1   Diabetes Grandchild        type 2   Social History   Socioeconomic History   Marital status: Married    Spouse name: Not on file   Number of children: Not on file   Years of education: Not on file   Highest education level: Not on file  Occupational History   Not on file  Tobacco Use   Smoking status: Former    Packs/day: 1.50    Years: 30.00    Total pack years: 45.00    Types: Cigarettes   Smokeless tobacco: Never  Vaping Use   Vaping Use: Never used  Substance and Sexual Activity   Alcohol use: No   Drug use: No   Sexual activity: Not Currently  Other Topics Concern   Not on file  Social History Narrative   Married    BS degree chemistry    Kids 59 y.o son    Owns guns, wears seat belt, safe in relationship    Social Determinants of Health   Financial Resource Strain: Not on file  Food Insecurity: Not on file  Transportation Needs: Not on  file  Physical Activity: Not on file  Stress: Not on file  Social Connections: Not on file  Intimate Partner Violence: Not on file   Current Meds  Medication Sig   carvedilol (COREG) 25 MG tablet Take 1 tablet (25 mg total) by mouth 2 (two) times daily with a meal. Dc 12.5 mg bid   doxazosin (CARDURA) 1 MG tablet Take 1 tablet (1 mg total) by mouth daily.   furosemide (LASIX) 20 MG tablet TAKE 1 TABLET BY MOUTH ONCE DAILY ON THE DAYS YOU DO NOT TAKE HCTZ   levothyroxine (EUTHYROX) 75 MCG tablet Take 1 tablet (75 mcg total) by mouth daily before breakfast.   lisinopril (ZESTRIL) 20 MG tablet Take 1 tablet (20 mg total) by mouth 2 (two) times daily.   NAPROXEN PO Take by mouth as needed.   Allergies  Allergen Reactions   Lipitor [Atorvastatin Calcium]  Muscle aches   Recent Results (from the past 2160 hour(s))  TSH     Status: None   Collection Time: 09/02/21 11:26 AM  Result Value Ref Range   TSH 2.714 0.350 - 4.500 uIU/mL    Comment: Performed by a 3rd Generation assay with a functional sensitivity of <=0.01 uIU/mL. Performed at [facility], [address] [place], [place] [address]   ECHOCARDIOGRAM COMPLETE     Status: None   Collection Time: 09/04/21 11:30 AM  Result Value Ref Range   AR max vel 2.88 cm2   AV Peak grad 9.7 mmHg   Ao pk vel 1.56 m/s   S' Lateral 4.00 cm   Area-P 1/2 2.63 cm2   AV Area VTI 3.04 cm2   AV Mean grad 5.0 mmHg   AV Area mean vel 2.78 cm2  Comprehensive metabolic panel     Status: Abnormal   Collection Time: 09/29/21 12:03 PM  Result Value Ref Range   Sodium 138 135 - 145 mEq/L   Potassium 4.1 3.5 - 5.1 mEq/L   Chloride 104 96 - 112 mEq/L   CO2 28 19 - 32 mEq/L   Glucose, Bld 95 70 - 99 mg/dL   BUN 21 6 - 23 mg/dL   Creatinine, Ser 1.63 (H) 0.40 - 1.50 mg/dL   Total Bilirubin 0.9 0.2 - 1.2 mg/dL   Alkaline Phosphatase 109 39 - 117 U/L   AST 14 0 - 37 U/L   ALT 17 0 - 53 U/L   Total Protein 7.2 6.0 - 8.3 g/dL   Albumin  4.0 3.5 - 5.2 g/dL   GFR 40.57 (L) >60.00 mL/min    Comment: Calculated using the CKD-EPI Creatinine Equation (2021)   Calcium 9.5 8.4 - 10.5 mg/dL  CBC w/Diff     Status: Abnormal   Collection Time: 09/29/21 12:03 PM  Result Value Ref Range   WBC 9.7 4.0 - 10.5 K/uL   RBC 4.62 4.22 - 5.81 Mil/uL   Hemoglobin 13.3 13.0 - 17.0 g/dL   HCT 40.5 39.0 - 52.0 %   MCV 87.7 78.0 - 100.0 fl   MCHC 32.7 30.0 - 36.0 g/dL   RDW 14.5 11.5 - 15.5 %   Platelets 261.0 150.0 - 400.0 K/uL   Neutrophils Relative % 77.2 (H) 43.0 - 77.0 %   Lymphocytes Relative 12.9 12.0 - 46.0 %   Monocytes Relative 6.5 3.0 - 12.0 %   Eosinophils Relative 2.5 0.0 - 5.0 %   Basophils Relative 0.9 0.0 - 3.0 %   Neutro Abs 7.5 1.4 - 7.7 K/uL   Lymphs Abs 1.3 0.7 - 4.0 K/uL   Monocytes Absolute 0.6 0.1 - 1.0 K/uL   Eosinophils Absolute 0.2 0.0 - 0.7 K/uL   Basophils Absolute 0.1 0.0 - 0.1 K/uL  Hemoglobin A1c     Status: None   Collection Time: 09/29/21 12:03 PM  Result Value Ref Range   Hgb A1c MFr Bld 5.8 4.6 - 6.5 %    Comment: Glycemic Control Guidelines for People with Diabetes:Non Diabetic:  <6%Goal of Therapy: <7%Additional Action Suggested:  >8%    Objective  Body mass index is 37.45 kg/m. Wt Readings from Last 3 Encounters:  10/27/21 261 lb (118.4 kg)  09/29/21 266 lb (120.7 kg)  09/02/21 266 lb (120.7 kg)   Temp Readings from Last 3 Encounters:  10/27/21 98.4 F (36.9 C) (Oral)  09/29/21 97.8 F (36.6 C) (Oral)  03/17/21 98.8 F (37.1 C) (Oral)  BP Readings from Last 3 Encounters:  10/27/21 136/88  10/15/21 (!) 174/98  09/29/21 132/80   Pulse Readings from Last 3 Encounters:  10/27/21 (!) 51  10/15/21 63  09/29/21 62    Physical Exam  Assessment  Plan  No diagnosis found.   HM Flu shot due had flu in 2006 and was very sick sch 10 or 12/2020    rec get Tdap rx   [employer] 4/4 11/2020    Will disc shingrix in future consider  Consider prevnar disc 03/17/21 declines, pna 23 decline  for now hep C neg    PSA elevated consider urology in future denied in past declines prostate MRI 11/02/19 he does not want to know if he has prostate cancer and this is why     Ref. Range 11/05/2019 09:11  PSA Latest Ref Range: 0.10 - 4.00 ng/mL 7.74 (H)      Denies colonoscopy  -declines cologuard   CT chest consider former smoker [phone] 1-2 ppd no FH lung cancer reviewed to consider prev discussed       Provider: Dr. Olivia Mackie McLean-Scocuzza-Internal Medicine

## 2021-10-27 NOTE — Patient Instructions (Addendum)
Dr. Leonie Man -call when ready to schedule an appt  Phone Fax E-mail Address  [phone] [email or web address] [address]   [address]   [place] [address]     Specialties     Neurology, Radiology      dermatology  Phone Fax E-mail Address  [phone] Not available [address]   [place] [address]     Specialties     Dermatology              Telmisartan >Losartan 25 mg 2x per day or 50 mg 2x per day >Valsartan (this was recalled about 5-6 years ago) consider taking this instead of Lisinopril   Let me know about losartan w/in the next 1 week to 2 weeks   My last day is 11/20/21

## 2021-11-03 ENCOUNTER — Encounter: Payer: Self-pay | Admitting: Internal Medicine

## 2021-11-17 ENCOUNTER — Ambulatory Visit: Payer: Medicare PPO | Admitting: Cardiovascular Disease

## 2021-11-17 ENCOUNTER — Ambulatory Visit: Payer: Medicare PPO | Admitting: Dermatology

## 2021-11-17 ENCOUNTER — Encounter: Payer: Self-pay | Admitting: Dermatology

## 2021-11-17 DIAGNOSIS — D2372 Other benign neoplasm of skin of left lower limb, including hip: Secondary | ICD-10-CM | POA: Diagnosis not present

## 2021-11-17 DIAGNOSIS — D492 Neoplasm of unspecified behavior of bone, soft tissue, and skin: Secondary | ICD-10-CM

## 2021-11-17 NOTE — Patient Instructions (Addendum)
Wound Care Instructions  Cleanse wound gently with soap and water once a day then pat dry with clean gauze. Spray with [facility] Hypochlorous Spray or Bactine (without lidocaine/numbing). Allow to dry. Then apply a thin coat of Petrolatum (petroleum jelly, "Vaseline") over the wound (unless you have an allergy to this). We recommend that you use a new, sterile tube of Vaseline. Do not pick or remove scabs. Do not remove the yellow or white "healing tissue" from the base of the wound.  Cover the wound with fresh, clean, nonstick gauze and secure with paper tape. You may use Band-Aids in place of gauze and tape if the wound is small enough, but would recommend trimming much of the tape off as there is often too much. Sometimes Band-Aids can irritate the skin. Wrap this area of the leg with an ACE wrap to help prevent swelling.  You should call the office for your biopsy report after 1 week if you have not already been contacted.  If you experience any problems, such as abnormal amounts of bleeding, swelling, significant bruising, significant pain, or evidence of infection, please call the office immediately.  FOR ADULT SURGERY PATIENTS: If you need something for pain relief you may take 1 extra strength Tylenol (acetaminophen) AND 2 Ibuprofen ('200mg'$  each) together every 4 hours as needed for pain. (do not take these if you are allergic to them or if you have a reason you should not take them.) Typically, you may only need pain medication for 1 to 3 days.     Due to recent changes in healthcare laws, you may see results of your pathology and/or laboratory studies on MyChart before the doctors have had a chance to review them. We understand that in some cases there may be results that are confusing or concerning to you. Please understand that not all results are received at the same time and often the doctors may need to interpret multiple results in order to provide you with the best plan of care or  course of treatment. Therefore, we ask that you please give [place] 2 business days to thoroughly review all your results before contacting the office for clarification. Should we see a critical lab result, you will be contacted sooner.   If You Need Anything After Your Visit  If you have any questions or concerns for your doctor, please call our main line at [phone] and press option 4 to reach your doctor's medical assistant. If no one answers, please leave a voicemail as directed and we will return your call as soon as possible. Messages left after 4 pm will be answered the following business day.   You may also send [place] a message via [place]. We typically respond to MyChart messages within 1-2 business days.  For prescription refills, please ask your pharmacy to contact our office. Our fax number is [phone].  If you have an urgent issue when the clinic is closed that cannot wait until the next business day, you can page your doctor at the number below.    Please note that while we do our best to be available for urgent issues outside of office hours, we are not available 24/7.   If you have an urgent issue and are unable to reach [place], you may choose to seek medical care at your doctor's office, retail clinic, urgent care center, or emergency room.  If you have a medical emergency, please immediately call 911 or go to the emergency department.  Pager Numbers  -  Dr. Nehemiah Massed: [phone]  - Dr. Laurence Ferrari: [phone]  - Dr. Nicole Kindred: [phone]  In the event of inclement weather, please call our main line at [phone] for an update on the status of any delays or closures.  Dermatology Medication Tips: Please keep the boxes that topical medications come in in order to help keep track of the instructions about where and how to use these. Pharmacies typically print the medication instructions only on the boxes and not directly on the medication tubes.   If your medication is too  expensive, please contact our office at [phone] option 4 or send [place] a message through [place].   We are unable to tell what your co-pay for medications will be in advance as this is different depending on your insurance coverage. However, we may be able to find a substitute medication at lower cost or fill out paperwork to get insurance to cover a needed medication.   If a prior authorization is required to get your medication covered by your insurance company, please allow [place] 1-2 business days to complete this process.  Drug prices often vary depending on where the prescription is filled and some pharmacies may offer cheaper prices.  The website www.goodrx.com contains coupons for medications through different pharmacies. The prices here do not account for what the cost may be with help from insurance (it may be cheaper with your insurance), but the website can give you the price if you did not use any insurance.  - You can print the associated coupon and take it with your prescription to the pharmacy.  - You may also stop by our office during regular business hours and pick up a GoodRx coupon card.  - If you need your prescription sent electronically to a different pharmacy, notify our office through [facility] or by phone at [phone] option 4.     Si Usted Necesita Algo Despus de Su Visita  Tambin puede enviarnos un mensaje a travs de Pharmacist, community. Por lo general respondemos a los mensajes de MyChart en el transcurso de 1 a 2 das hbiles.  Para renovar recetas, por favor pida a su farmacia que se ponga en contacto con nuestra oficina. Harland Dingwall de fax es [place] [phone].  Si tiene un asunto urgente cuando la clnica est cerrada y que no puede esperar hasta el siguiente da hbil, puede llamar/localizar a su doctor(a) al nmero que aparece a continuacin.   Por favor, tenga en cuenta que aunque hacemos todo lo posible para estar disponibles para asuntos urgentes fuera  del horario de [place], no estamos disponibles las 24 horas del da, los 7 das de la [place].   Si tiene un problema urgente y no puede comunicarse con nosotros, puede optar por buscar atencin mdica  en el consultorio de su doctor(a), en una clnica privada, en un centro de atencin urgente o en una sala de emergencias.  Si tiene Engineering geologist, por favor llame inmediatamente al 911 o vaya a la sala de emergencias.  Nmeros de bper  - Dr. Nehemiah Massed: [phone]  - Dra. Moye: [phone]  - Dra. Nicole Kindred: [phone]  En caso de inclemencias del [place], por favor llame a Johnsie Kindred principal al [phone] para una actualizacin sobre el [place] de cualquier retraso o cierre.  Consejos para la medicacin en dermatologa: Por favor, guarde las cajas en las que vienen los medicamentos de uso tpico para ayudarle a seguir las instrucciones sobre dnde y cmo usarlos. [place] instrucciones del medicamento  slo en las cajas y no directamente en los tubos del medicamento.   Si su medicamento es muy caro, por favor, pngase en contacto con Zigmund Daniel llamando al [phone] y presione la opcin 4 o envenos un mensaje a travs de Pharmacist, community.   No podemos decirle cul ser su copago por los medicamentos por adelantado ya que esto es diferente dependiendo de la cobertura de su seguro. Sin embargo, es posible que podamos encontrar un medicamento sustituto a Electrical engineer un formulario para que el seguro cubra el medicamento que se considera necesario.   Si se requiere una autorizacin previa para que su compaa de seguros [place] su medicamento, por favor permtanos de 1 a 2 das hbiles para completar este proceso.  Los precios de los medicamentos varan con frecuencia dependiendo del Environmental consultant de dnde se surte la receta y alguna farmacias pueden ofrecer precios ms baratos.  El sitio web www.goodrx.com tiene cupones para medicamentos de Office manager. Los precios aqu no tienen en cuenta lo que podra costar con la ayuda del seguro (puede ser ms barato con su seguro), pero el sitio web puede darle el precio si no utiliz Research scientist (physical sciences).  - Puede imprimir el cupn correspondiente y llevarlo con su receta a la farmacia.  - Tambin puede pasar por nuestra oficina durante el horario de atencin regular y Charity fundraiser una tarjeta de cupones de GoodRx.  - Si necesita que su receta se enve electrnicamente a una farmacia diferente, informe a nuestra oficina a travs de MyChart de [place] o por telfono llamando al [phone] y presione la opcin 4.

## 2021-11-17 NOTE — Progress Notes (Signed)
   New Patient Visit  Subjective  Logan [name] is a 76 y.o. male who presents for the following: lesion (Left lower leg. Dur: 5 years. Denies itching. Bleeds if picked).  The patient has spots, moles and lesions to be evaluated, some may be new or changing and the patient has concerns that these could be cancer.  Review of Systems: No other skin or systemic complaints except as noted in HPI or Assessment and Plan.   Objective  Well appearing patient in no apparent distress; mood and affect are within normal limits.  A focused examination was performed including left leg. Relevant physical exam findings are noted in the Assessment and Plan.  Left Lower Leg - Anterior 1.1 cm black to violaceous nodule        Assessment & Plan  Neoplasm of skin Left Lower Leg - Anterior  Epidermal / dermal shaving  Lesion diameter (cm):  1.1 Informed consent: discussed and consent obtained   Patient was prepped and draped in usual sterile fashion: Area prepped with alcohol. Anesthesia: the lesion was anesthetized in a standard fashion   Anesthetic:  1% lidocaine w/ epinephrine 1-100,000 buffered w/ 8.4% NaHCO3 Instrument used: #15 blade   Hemostasis achieved with: pressure, aluminum chloride and electrodesiccation   Outcome: patient tolerated procedure well   Post-procedure details: wound care instructions given   Post-procedure details comment:  Ointment and small bandage applied  Specimen 1 - Surgical pathology Differential Diagnosis: R/O MM   Check Margins: No   Return for Wound Check in 1 week.  I, Emelia Salisbury, CMA, am acting as scribe for Forest Gleason, MD.   Documentation: I have reviewed the above documentation for accuracy and completeness, and I agree with the above.  Forest Gleason, MD

## 2021-11-23 ENCOUNTER — Telehealth: Payer: Self-pay

## 2021-11-23 NOTE — Telephone Encounter (Signed)
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  MAs please call to review results and schedule for wound check in a couple of weeks. If pt does not want to do a wound check, that is fine and they can message or call as needed. Thank you!

## 2021-11-23 NOTE — Telephone Encounter (Signed)
Left message for patient to call office if he would like to schedule wound check. Lurlean Horns., RMA

## 2022-01-26 ENCOUNTER — Ambulatory Visit: Payer: Medicare PPO | Admitting: Family Medicine

## 2022-01-26 ENCOUNTER — Encounter: Payer: Self-pay | Admitting: Family Medicine

## 2022-01-26 VITALS — BP 128/84 | HR 55 | Temp 96.7°F | Ht 70.0 in | Wt 256.4 lb

## 2022-01-26 DIAGNOSIS — N1832 Chronic kidney disease, stage 3b: Secondary | ICD-10-CM

## 2022-01-26 DIAGNOSIS — E782 Mixed hyperlipidemia: Secondary | ICD-10-CM

## 2022-01-26 DIAGNOSIS — I639 Cerebral infarction, unspecified: Secondary | ICD-10-CM

## 2022-01-26 DIAGNOSIS — R7989 Other specified abnormal findings of blood chemistry: Secondary | ICD-10-CM

## 2022-01-26 DIAGNOSIS — I10 Essential (primary) hypertension: Secondary | ICD-10-CM | POA: Diagnosis not present

## 2022-01-26 DIAGNOSIS — I7121 Aneurysm of the ascending aorta, without rupture: Secondary | ICD-10-CM | POA: Diagnosis not present

## 2022-01-26 DIAGNOSIS — I25118 Atherosclerotic heart disease of native coronary artery with other forms of angina pectoris: Secondary | ICD-10-CM

## 2022-01-26 DIAGNOSIS — R7303 Prediabetes: Secondary | ICD-10-CM

## 2022-01-26 DIAGNOSIS — I2510 Atherosclerotic heart disease of native coronary artery without angina pectoris: Secondary | ICD-10-CM

## 2022-01-26 DIAGNOSIS — E039 Hypothyroidism, unspecified: Secondary | ICD-10-CM

## 2022-01-26 DIAGNOSIS — I5032 Chronic diastolic (congestive) heart failure: Secondary | ICD-10-CM

## 2022-01-26 DIAGNOSIS — Z6841 Body Mass Index (BMI) 40.0 and over, adult: Secondary | ICD-10-CM

## 2022-01-26 DIAGNOSIS — Z8673 Personal history of transient ischemic attack (TIA), and cerebral infarction without residual deficits: Secondary | ICD-10-CM | POA: Diagnosis not present

## 2022-01-26 DIAGNOSIS — E559 Vitamin D deficiency, unspecified: Secondary | ICD-10-CM

## 2022-01-26 NOTE — Progress Notes (Signed)
SUBJECTIVE:   Chief Complaint  Patient presents with   Establish Care    Transfer of Care   HPI Patient presents to clinic to transfer care.  No acute concerns today.  Hypertension Asymptomatic.  Lisinopril was previously discontinued.  Was started on Cozaar 50 mg.  Patient reports when starting Cozaar he started to have sleep issues and feeling weird.  Reports having restarted taking lisinopril 20 mg twice daily.  Blood pressure at home 120s/80s.  Tolerating lisinopril better and would like to remain on medication.  Not sure why medication was changed.  Also takes Cardura 1 mg daily, carvedilol 25 mg twice daily and Lasix 20 mg daily.  Hypothyroidism Asymptomatic.  Takes Synthroid 75 mcg's daily and tolerating well.  Hyperlipidemia Not currently on statin.  Reports history of statin myalgias.  CVA Left frontal CVA and bilateral chronic lacunar infarcts noted on MRI head from 09/29/2021.  Referred to neurology at that time however patient canceled appointment and declined referral.  Has had no previous symptoms since.  Doing well.  Not currently on statin therapy.  Blood pressure remains controlled on current antihypertensives.   PERTINENT PMH / PSH: Hypertension CAD status post CABG/ CVA Hypothyroidism Stasis dermatitis Stage IIIb CKD Hyperlipidemia Morbid obesity   OBJECTIVE:  BP 128/84   Pulse (!) 55   Temp (!) 96.7 F (35.9 C)   Ht '5\' 10"'$  (1.778 m)   Wt 256 lb 6.4 oz (116.3 kg)   SpO2 99%   BMI 36.79 kg/m    Physical Exam Vitals reviewed.  Constitutional:      General: He is not in acute distress.    Appearance: He is normal weight. He is not ill-appearing.  HENT:     Head: Normocephalic.  Eyes:     Conjunctiva/sclera: Conjunctivae normal.  Cardiovascular:     Rate and Rhythm: Normal rate and regular rhythm.     Pulses: Normal pulses.  Pulmonary:     Effort: Pulmonary effort is normal.     Breath sounds: Normal breath sounds.  Abdominal:      General: Bowel sounds are normal.  Neurological:     Mental Status: He is alert. Mental status is at baseline.  Psychiatric:        Mood and Affect: Mood normal.        Behavior: Behavior normal.        Thought Content: Thought content normal.        Judgment: Judgment normal.     ASSESSMENT/PLAN:  Essential hypertension Assessment & Plan: Chronic.  Stable.  Per JNC 8 guidelines at goal for age less than 140/90. Has self discontinued Cozaar and restarted lisinopril and tolerating well. Continue lisinopril 20 mg twice daily Continue carvedilol 25 mg twice daily Continue Cardura 1 mg daily.  Given history of falls and dizziness with advancing age, consider weaning in future if possible.  Continue Lasix 20 mg daily Continue to follow with cardiology as scheduled.    Orders: -     Comprehensive metabolic panel; Future -     CBC with Differential/Platelet; Future -     Comprehensive metabolic panel; Future  Cerebrovascular accident (CVA), unspecified mechanism ([place]) Assessment & Plan: Chronic.  Stable.  Not on statin therapy Blood pressure well-controlled Declined previous neurology referral.   Stage 3b chronic kidney disease ([place]) Assessment & Plan: Chronic.  Stable.  Recent GFR at baseline.   Consider nephrology consult in future.    Aneurysm of ascending aorta without rupture [facility]) Assessment &  Plan: Chronic.  Stable.  38 mm dilation of ascending aorta noted on recent echo from 09/2021. Hypertension controlled Follows with cardiology   Coronary artery disease of native artery of native heart with stable angina pectoris [facility]) Assessment & Plan: Chronic.  Asymptomatic. 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    Chronic diastolic heart failure ([facility]) Assessment & Plan: Chronic.  Stable.  Recent echo shows LVEF 50-65%, G1DD.  Borderline dilation of ascending aorta noted at 38 mm.  Euvolemic on exam. Continue carvedilol 25  mg twice daily Continue Cardura 1 mg daily Continue Lasix 20 mg daily Continue lisinopril 20 mg twice daily Continue to follow with cardiology as scheduled    Mixed hyperlipidemia Assessment & Plan: Chronic.  Stable. Not currently on statin therapy for history of statin myopathy Check lipids at next visit  Orders: -     Lipid panel; Future  Elevated TSH -     TSH; Future  Prediabetes -     Hemoglobin A1c; Future -     Vitamin B12; Future  Vitamin D deficiency -     VITAMIN D 25 Hydroxy (Vit-D Deficiency, Fractures); Future  Hypothyroidism, unspecified type Assessment & Plan: Chronic.  Asymptomatic.  Last TSH within normal limits.  Tolerating current medication. Continue levothyroxine 75 mcg daily Repeat TSH annually   Atherosclerosis of native coronary artery of native heart without angina pectoris Assessment & Plan: Chronic.  Asymptomatic. 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    HCM History of tobacco use, plan for abdominal ultrasound discussion at next visit Low-dose CT for lung cancer screening due Recommend pneumonia 20 vaccine Recommend shingles vaccine Flu vaccine up-to-date Will need to readdress DNR status at next visit  PDMP reviewed  Return in about 3 months (around 04/27/2022) for PCP.  Carollee Leitz, MD

## 2022-01-26 NOTE — Patient Instructions (Addendum)
It was a pleasure meeting you today. Thank you for allowing me to take part in your health care.  Our goals for today as we discussed include:  For your blood pressure Continue Carvedilol 25 mg two times a day.  This is the maximum dose  Continue Lisinopril 20 mg two times a day.  This is the maximum dose. Continue Furosemide 20 mg daily Continue Doxazosin 1 mg daily. Would like to wean this one in the future if possible as it can cause lower blood pressures and puts you at risk for falls  Recommend follow up with Cardiology   Recommend follow up with Neurology   Follow up in 3 months with labs  Follow up in 1 year with labs for annual physical  If you have any questions or concerns, please do not hesitate to call the office at [phone].  I look forward to our next visit and until then take care and stay safe.  Regards,   Carollee Leitz, MD   [facility]

## 2022-02-12 ENCOUNTER — Encounter: Payer: Self-pay | Admitting: Family Medicine

## 2022-02-12 DIAGNOSIS — I5032 Chronic diastolic (congestive) heart failure: Secondary | ICD-10-CM | POA: Insufficient documentation

## 2022-02-12 DIAGNOSIS — I719 Aortic aneurysm of unspecified site, without rupture: Secondary | ICD-10-CM | POA: Insufficient documentation

## 2022-02-12 NOTE — Assessment & Plan Note (Signed)
Chronic.  Stable.  Recent GFR at baseline.   Consider nephrology consult in future.

## 2022-02-12 NOTE — Assessment & Plan Note (Addendum)
Chronic.  Stable.  Per JNC 8 guidelines at goal for age less than 140/90. Has self discontinued Cozaar and restarted lisinopril and tolerating well. Continue lisinopril 20 mg twice daily Continue carvedilol 25 mg twice daily Continue Cardura 1 mg daily.  Given history of falls and dizziness with advancing age, consider weaning in future if possible.  Continue Lasix 20 mg daily Continue to follow with cardiology as scheduled.

## 2022-02-12 NOTE — Assessment & Plan Note (Signed)
Chronic.  Stable. Not currently on statin therapy for history of statin myopathy Check lipids at next visit

## 2022-02-12 NOTE — Assessment & Plan Note (Signed)
Chronic.  Stable.  38 mm dilation of ascending aorta noted on recent echo from 09/2021. Hypertension controlled Follows with cardiology

## 2022-02-12 NOTE — Assessment & Plan Note (Signed)
Chronic.  Asymptomatic. 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

## 2022-02-12 NOTE — Assessment & Plan Note (Signed)
Chronic.  Asymptomatic.  Last TSH within normal limits.  Tolerating current medication. Continue levothyroxine 75 mcg daily Repeat TSH annually

## 2022-02-12 NOTE — Assessment & Plan Note (Addendum)
Chronic.  Stable.  Recent echo shows LVEF 50-65%, G1DD.  Borderline dilation of ascending aorta noted at 38 mm.  Euvolemic on exam. Continue carvedilol 25 mg twice daily Continue Cardura 1 mg daily Continue Lasix 20 mg daily Continue lisinopril 20 mg twice daily Continue to follow with cardiology as scheduled

## 2022-02-12 NOTE — Assessment & Plan Note (Addendum)
Chronic.  Stable.  Not on statin therapy Blood pressure well-controlled Declined previous neurology referral.

## 2022-03-15 ENCOUNTER — Encounter: Payer: Self-pay | Admitting: Family Medicine

## 2022-03-31 ENCOUNTER — Telehealth: Payer: Self-pay | Admitting: Family Medicine

## 2022-03-31 NOTE — Telephone Encounter (Signed)
Contacted Louellen Molder [facility] to schedule their annual wellness visit. Patient declined to schedule AWV at this time.  Patient received message through my chart to schedule. Patient doesn't want to do AWV.  FYI in patient's chart states he doesn't want AWV.  Patient said he's had some bad experiences with [facility] recently and had 3 strokes. He does want to continue w/ PCP.  Thank you,  [place] Direct dial  [phone]

## 2022-04-26 ENCOUNTER — Other Ambulatory Visit: Payer: Self-pay | Admitting: Medical

## 2022-04-28 ENCOUNTER — Other Ambulatory Visit: Payer: Medicare PPO

## 2022-04-30 ENCOUNTER — Telehealth: Payer: Self-pay | Admitting: Family Medicine

## 2022-04-30 DIAGNOSIS — I10 Essential (primary) hypertension: Secondary | ICD-10-CM

## 2022-04-30 NOTE — Telephone Encounter (Signed)
Patient had refills left and I called the pharmacy to fill.  Johnay Mano,cma

## 2022-04-30 NOTE — Telephone Encounter (Signed)
Pt need a refill on doxazosin sent to [facility]

## 2022-04-30 NOTE — Telephone Encounter (Signed)
Pt does not wish to schedule at this time and is going to see PCP for med refill.

## 2022-04-30 NOTE — Telephone Encounter (Signed)
Please schedule overdue F/U appointment. Thank you! ?

## 2022-05-05 ENCOUNTER — Ambulatory Visit: Payer: Medicare PPO | Admitting: Family Medicine

## 2022-05-17 ENCOUNTER — Other Ambulatory Visit (INDEPENDENT_AMBULATORY_CARE_PROVIDER_SITE_OTHER): Payer: Medicare PPO

## 2022-05-17 DIAGNOSIS — E782 Mixed hyperlipidemia: Secondary | ICD-10-CM

## 2022-05-17 DIAGNOSIS — R7989 Other specified abnormal findings of blood chemistry: Secondary | ICD-10-CM | POA: Diagnosis not present

## 2022-05-17 DIAGNOSIS — R7303 Prediabetes: Secondary | ICD-10-CM | POA: Diagnosis not present

## 2022-05-17 DIAGNOSIS — E559 Vitamin D deficiency, unspecified: Secondary | ICD-10-CM | POA: Diagnosis not present

## 2022-05-17 DIAGNOSIS — I10 Essential (primary) hypertension: Secondary | ICD-10-CM | POA: Diagnosis not present

## 2022-05-17 LAB — LIPID PANEL
Cholesterol: 180 mg/dL (ref 0–200)
HDL: 34.1 mg/dL — ABNORMAL LOW (ref >39.00)
LDL Cholesterol: 119 mg/dL — ABNORMAL HIGH (ref 0–99)
NonHDL: 145.56
Total CHOL/HDL Ratio: 5
Triglycerides: 132 mg/dL (ref 0.0–149.0)
VLDL: 26.4 mg/dL (ref 0.0–40.0)

## 2022-05-17 LAB — CBC WITH DIFFERENTIAL/PLATELET
Basophils Absolute: 0.1 10*3/uL (ref 0.0–0.1)
Basophils Relative: 0.9 % (ref 0.0–3.0)
Eosinophils Absolute: 0.3 10*3/uL (ref 0.0–0.7)
Eosinophils Relative: 2.9 % (ref 0.0–5.0)
HCT: 41 % (ref 39.0–52.0)
Hemoglobin: 13.5 g/dL (ref 13.0–17.0)
Lymphocytes Relative: 13.1 % (ref 12.0–46.0)
Lymphs Abs: 1.2 10*3/uL (ref 0.7–4.0)
MCHC: 32.9 g/dL (ref 30.0–36.0)
MCV: 86.5 fl (ref 78.0–100.0)
Monocytes Absolute: 0.7 10*3/uL (ref 0.1–1.0)
Monocytes Relative: 7.6 % (ref 3.0–12.0)
Neutro Abs: 6.9 10*3/uL (ref 1.4–7.7)
Neutrophils Relative %: 75.5 % (ref 43.0–77.0)
Platelets: 243 10*3/uL (ref 150.0–400.0)
RBC: 4.75 Mil/uL (ref 4.22–5.81)
RDW: 15.3 % (ref 11.5–15.5)
WBC: 9.1 10*3/uL (ref 4.0–10.5)

## 2022-05-17 LAB — COMPREHENSIVE METABOLIC PANEL
ALT: 15 U/L (ref 0–53)
AST: 15 U/L (ref 0–37)
Albumin: 4 g/dL (ref 3.5–5.2)
Alkaline Phosphatase: 108 U/L (ref 39–117)
BUN: 22 mg/dL (ref 6–23)
CO2: 28 mEq/L (ref 19–32)
Calcium: 9.3 mg/dL (ref 8.4–10.5)
Chloride: 103 mEq/L (ref 96–112)
Creatinine, Ser: 1.82 mg/dL — ABNORMAL HIGH (ref 0.40–1.50)
GFR: 35.39 mL/min — ABNORMAL LOW (ref >60.00)
Glucose, Bld: 89 mg/dL (ref 70–99)
Potassium: 4.4 mEq/L (ref 3.5–5.1)
Sodium: 140 mEq/L (ref 135–145)
Total Bilirubin: 0.9 mg/dL (ref 0.2–1.2)
Total Protein: 6.9 g/dL (ref 6.0–8.3)

## 2022-05-17 LAB — HEMOGLOBIN A1C: Hgb A1c MFr Bld: 5.6 % (ref 4.6–6.5)

## 2022-05-17 LAB — VITAMIN D 25 HYDROXY (VIT D DEFICIENCY, FRACTURES): VITD: 29.52 ng/mL — ABNORMAL LOW (ref 30.00–100.00)

## 2022-05-17 LAB — TSH: TSH: 3.15 u[IU]/mL (ref 0.35–5.50)

## 2022-05-17 LAB — VITAMIN B12: Vitamin B-12: 378 pg/mL (ref 211–911)

## 2022-05-18 ENCOUNTER — Other Ambulatory Visit: Payer: Self-pay | Admitting: Family Medicine

## 2022-05-18 DIAGNOSIS — E559 Vitamin D deficiency, unspecified: Secondary | ICD-10-CM

## 2022-05-19 ENCOUNTER — Telehealth: Payer: Self-pay | Admitting: Family Medicine

## 2022-05-19 NOTE — Telephone Encounter (Signed)
Called patient to schedule Medicare Annual Wellness Visit (AWV). Left message for patient to call back and schedule Medicare Annual Wellness Visit (AWV).  Last date of AWV: AWVI eligible as of 11/09/2010  Please schedule an AWVI appointment at any time with [facility] VISIT.  If any questions, please contact me at [phone].   Thank you,  [facility] Support [facility] Direct dial  [phone]

## 2022-05-24 ENCOUNTER — Encounter: Payer: Self-pay | Admitting: Family Medicine

## 2022-05-24 ENCOUNTER — Ambulatory Visit: Payer: Medicare PPO | Admitting: Family Medicine

## 2022-05-24 ENCOUNTER — Telehealth: Payer: Self-pay

## 2022-05-24 VITALS — BP 160/90 | HR 62 | Temp 98.6°F | Ht 70.0 in | Wt 257.4 lb

## 2022-05-24 DIAGNOSIS — I7121 Aneurysm of the ascending aorta, without rupture: Secondary | ICD-10-CM

## 2022-05-24 DIAGNOSIS — I10 Essential (primary) hypertension: Secondary | ICD-10-CM

## 2022-05-24 DIAGNOSIS — N1832 Chronic kidney disease, stage 3b: Secondary | ICD-10-CM

## 2022-05-24 DIAGNOSIS — N183 Chronic kidney disease, stage 3 unspecified: Secondary | ICD-10-CM

## 2022-05-24 DIAGNOSIS — E039 Hypothyroidism, unspecified: Secondary | ICD-10-CM | POA: Diagnosis not present

## 2022-05-24 MED ORDER — AMLODIPINE BESYLATE 5 MG PO TABS
5.0000 mg | ORAL_TABLET | Freq: Every day | ORAL | 0 refills | Status: DC
Start: 2022-05-24 — End: 2022-08-17

## 2022-05-24 NOTE — Progress Notes (Signed)
SUBJECTIVE:   Chief Complaint  Patient presents with   Medical Management of Chronic Issues    3 mo follow up   HPI Patient presents to clinic for follow-up blood pressure management  Hypertension Previously seen in clinic on 01/26/2022.  Blood pressure at that time 128/84.  He had stopped Cozaar secondary to fatigue and restarted taking his lisinopril 20 mg twice daily.  Today he presents with elevated blood pressure.  He is currently taking Coreg 25 mg twice daily, Zestril 20 mg twice daily.  Reports discontinuing Cardura 1 mg daily as he tried to get refill from his cardiologist but was told he could not have a refill until he was seen by clinic.  He also takes Lasix 20 mg daily.  He recently reviewed his blood work and is concerned about his kidney functions.  He has stopped soda intake and has now decided to increase fluid intake.  He is willing to retry amlodipine again as he was unable to tolerate increased dose Cardura.   PERTINENT PMH / PSH: Hypertension Hypothyroid CKD stage IIIb  OBJECTIVE:  BP (!) 160/90   Pulse 62   Temp 98.6 F (37 C) (Oral)   Ht 5\' 10"  (1.778 m)   Wt 257 lb 6.4 oz (116.8 kg)   SpO2 94%   BMI 36.93 kg/m    Physical Exam Vitals reviewed.  Constitutional:      General: He is not in acute distress.    Appearance: He is normal weight. He is not ill-appearing.  HENT:     Head: Normocephalic.  Eyes:     Conjunctiva/sclera: Conjunctivae normal.  Cardiovascular:     Rate and Rhythm: Normal rate and regular rhythm.     Pulses: Normal pulses.  Pulmonary:     Effort: Pulmonary effort is normal.     Breath sounds: Normal breath sounds.  Abdominal:     General: Bowel sounds are normal.  Neurological:     Mental Status: He is alert. Mental status is at baseline.  Psychiatric:        Mood and Affect: Mood normal.        Behavior: Behavior normal.        Thought Content: Thought content normal.        Judgment: Judgment normal.      ASSESSMENT/PLAN:  Essential hypertension Assessment & Plan: Chronic.  Uncontrolled on current medication.  Not interested in restarting Cardura Continue lisinopril 20 mg twice daily Continue carvedilol 25 mg twice daily Restart amlodipine 5 mg daily Continue Lasix 20 mg daily Follow-up in 4 weeks Recommend he reach out to cardiology for continued evaluation.     Orders: -     amLODIPine Besylate; Take 1 tablet (5 mg total) by mouth daily.  Dispense: 90 tablet; Refill: 0  Aneurysm of ascending aorta without rupture Assessment & Plan: Chronic.  Stable.  38 mm dilation of ascending aorta noted on recent echo from 09/2021. Hypertension uncontrolled Follows with cardiology   Hypothyroidism, unspecified type Assessment & Plan: Chronic.  Asymptomatic.   Continue levothyroxine 75 mcg daily Repeat TSH annually   Stage 3b chronic kidney disease Assessment & Plan: Chronic.  Stable.  GFR decreased from 40.57-36.39. Recommend nephrology consult.  Patient politely declined.  He would like to increase his fluids and recheck his kidney functions in 1 month time after which he will decide if he would like to have a nephrology referral.   Orders: -     Renal function panel; Future  PDMP reviewed  Return in about 4 weeks (around 06/21/2022) for PCP.  Dana Allan, MD

## 2022-05-24 NOTE — Patient Instructions (Addendum)
It was a pleasure meeting you today. Thank you for allowing me to take part in your health care.  Our goals for today as we discussed include:  Schedule Medicare Annual Wellness Visit   Recommend Shingles vaccine.  This is a 2 dose series and can be given at your local pharmacy.  Please talk to your pharmacist about this.   Recommend Pneumonia 20 vaccine Recommend low dose CT chest for lung cancer screening  Start Amlodipine 5 mg daily Continue Lisinopril 20 mg two times  a day Continue Carvedilol 25 mg two times a day Limit salt intake Recommend you follow up with Cardiology.  Please mychart me results of your blood pressure and heart rate in 2 weeks Monitor blood pressure.  Goal is less than 130/80   Your kidney function has declined Recommend you see a kidney doctor Avoid Naproxen, Ibuprofen or any products containing NSAIDs   Follow up in 4 weeks   If you have any questions or concerns, please do not hesitate to call the office at [phone].  I look forward to our next visit and until then take care and stay safe.  Regards,   Dana Allan, MD   [facility]

## 2022-05-24 NOTE — Telephone Encounter (Signed)
At check-out today, patient states he needs to have his GFR tested one week prior to his appointment with Dr. Dana Allan.    I scheduled non-fasting lab visit per patient request 1 week prior to follow-up with Dr. Dana Allan.  I do not see lab order in system at this time, so it will need to be added.

## 2022-05-25 NOTE — Telephone Encounter (Signed)
I changed the test to future so it can be seen for upcoming lab appt.

## 2022-05-29 ENCOUNTER — Encounter: Payer: Self-pay | Admitting: Family Medicine

## 2022-05-29 NOTE — Assessment & Plan Note (Signed)
Chronic.  Stable.  38 mm dilation of ascending aorta noted on recent echo from 09/2021. Hypertension uncontrolled Follows with cardiology

## 2022-05-29 NOTE — Assessment & Plan Note (Signed)
Chronic.  Stable.  GFR decreased from 40.57-36.39. Recommend nephrology consult.  Patient politely declined.  He would like to increase his fluids and recheck his kidney functions in 1 month time after which he will decide if he would like to have a nephrology referral.

## 2022-05-29 NOTE — Assessment & Plan Note (Signed)
Chronic.  Asymptomatic.   Continue levothyroxine 75 mcg daily Repeat TSH annually

## 2022-05-29 NOTE — Assessment & Plan Note (Signed)
Chronic.  Uncontrolled on current medication.  Not interested in restarting Cardura Continue lisinopril 20 mg twice daily Continue carvedilol 25 mg twice daily Restart amlodipine 5 mg daily Continue Lasix 20 mg daily Follow-up in 4 weeks Recommend he reach out to cardiology for continued evaluation.

## 2022-06-07 ENCOUNTER — Encounter: Payer: Self-pay | Admitting: Family Medicine

## 2022-06-15 ENCOUNTER — Other Ambulatory Visit (INDEPENDENT_AMBULATORY_CARE_PROVIDER_SITE_OTHER): Payer: Medicare PPO

## 2022-06-15 DIAGNOSIS — N1832 Chronic kidney disease, stage 3b: Secondary | ICD-10-CM

## 2022-06-15 LAB — RENAL FUNCTION PANEL
Albumin: 3.8 g/dL (ref 3.5–5.2)
BUN: 31 mg/dL — ABNORMAL HIGH (ref 6–23)
CO2: 26 mEq/L (ref 19–32)
Calcium: 9.1 mg/dL (ref 8.4–10.5)
Chloride: 103 mEq/L (ref 96–112)
Creatinine, Ser: 1.7 mg/dL — ABNORMAL HIGH (ref 0.40–1.50)
GFR: 38.38 mL/min — ABNORMAL LOW (ref >60.00)
Glucose, Bld: 107 mg/dL — ABNORMAL HIGH (ref 70–99)
Phosphorus: 3.3 mg/dL (ref 2.3–4.6)
Potassium: 4.2 mEq/L (ref 3.5–5.1)
Sodium: 137 mEq/L (ref 135–145)

## 2022-06-22 ENCOUNTER — Ambulatory Visit: Payer: Medicare PPO | Admitting: Family Medicine

## 2022-06-22 ENCOUNTER — Encounter: Payer: Self-pay | Admitting: Family Medicine

## 2022-06-22 VITALS — BP 152/88 | HR 66 | Ht 70.0 in | Wt 259.0 lb

## 2022-06-22 DIAGNOSIS — T466X5A Adverse effect of antihyperlipidemic and antiarteriosclerotic drugs, initial encounter: Secondary | ICD-10-CM | POA: Diagnosis not present

## 2022-06-22 DIAGNOSIS — M791 Myalgia, unspecified site: Secondary | ICD-10-CM

## 2022-06-22 DIAGNOSIS — I10 Essential (primary) hypertension: Secondary | ICD-10-CM | POA: Diagnosis not present

## 2022-06-22 DIAGNOSIS — I25118 Atherosclerotic heart disease of native coronary artery with other forms of angina pectoris: Secondary | ICD-10-CM

## 2022-06-22 DIAGNOSIS — E039 Hypothyroidism, unspecified: Secondary | ICD-10-CM

## 2022-06-22 DIAGNOSIS — N1832 Chronic kidney disease, stage 3b: Secondary | ICD-10-CM

## 2022-06-22 NOTE — Patient Instructions (Addendum)
It was a pleasure seeing you today. Thank you for allowing me to take part in your health care.  Our goals for today as we discussed include:  Glad you are feeling better and blood pressure improving on the Amlodipine Continue Amlodipine 5 mg at night Will recheck blood work at next visit.  Monitor blood pressure at home.  Goal <140/90  Follow up in 2 months or sooner if needed  Schedule Medicare Annual Wellness Visit   Recommend Shingles vaccine.  This is a 2 dose series and can be given at your local pharmacy.  Please talk to your pharmacist about this.   Recommend Pneumonia 20 vaccine.  One time vaccine.   If you have any questions or concerns, please do not hesitate to call the office at [phone].  I look forward to our next visit and until then take care and stay safe.  Regards,   Dana Allan, MD   [facility]

## 2022-06-22 NOTE — Progress Notes (Signed)
   SUBJECTIVE:   Chief Complaint  Patient presents with   Medical Management of Chronic Issues   HPI Patient presents to clinic for follow-up blood pressure management  Hypertension Last seen in clinic 04/15 for hypertension management.  Amlodipine 5 mg daily was added at the time.  He continues to take Coreg 25 mg twice daily and lisinopril 20 mg daily.  He reports blood pressure at home is much improved since starting amlodipine. He is tolerating well but does endorse increased fatigue that is improving.  Denies any headaches, visual changes, chest pain, shortness of breath, lower extremity edema.  PERTINENT PMH / PSH: Hypertension Hypothyroid CKD stage IIIb  OBJECTIVE:  BP (!) 152/88   Pulse 66   Ht 5\' 10"  (1.778 m)   Wt 259 lb (117.5 kg)   SpO2 99%   BMI 37.16 kg/m    Physical Exam Vitals reviewed.  Constitutional:      General: He is not in acute distress.    Appearance: He is normal weight. He is not ill-appearing.  HENT:     Head: Normocephalic.  Eyes:     Conjunctiva/sclera: Conjunctivae normal.  Cardiovascular:     Rate and Rhythm: Normal rate and regular rhythm.     Pulses: Normal pulses.  Pulmonary:     Effort: Pulmonary effort is normal.     Breath sounds: Normal breath sounds.  Abdominal:     General: Bowel sounds are normal.  Neurological:     Mental Status: He is alert. Mental status is at baseline.  Psychiatric:        Mood and Affect: Mood normal.        Behavior: Behavior normal.        Thought Content: Thought content normal.        Judgment: Judgment normal.     ASSESSMENT/PLAN:  Essential hypertension Assessment & Plan: Chronic.  Continues to remain elevated but has much improved since last visit.  Recommend increasing amlodipine however patient would like to continue current dose for another few weeks. Continue lisinopril 20 mg twice daily Continue carvedilol 25 mg twice daily Continue new amlodipine 5 mg daily Continue Lasix 20 mg  daily, taking as needed Continue to monitor blood pressure at home. Follow-up in 8 weeks Strict return precautions provided     Coronary artery disease of native artery of native heart with stable angina pectoris [facility]) Assessment & Plan: Chronic.  Asymptomatic. Declined statin therapy     Myalgia due to statin Assessment & Plan: Chronic.  Previously tried pravastatin but discontinued secondary to worsening myalgias.  Patient not interested in restarting medication.   HCM Recommend pneumonia 20 vaccine Recommend shingles vaccinations Medicare annual wellness visit due.  Patient to schedule appointment.  PDMP reviewed  Return in about 2 months (around 08/22/2022).  Dana Allan, MD

## 2022-07-06 DIAGNOSIS — T466X5A Adverse effect of antihyperlipidemic and antiarteriosclerotic drugs, initial encounter: Secondary | ICD-10-CM | POA: Insufficient documentation

## 2022-07-06 DIAGNOSIS — M791 Myalgia, unspecified site: Secondary | ICD-10-CM | POA: Insufficient documentation

## 2022-07-06 NOTE — Assessment & Plan Note (Signed)
Chronic.  Asymptomatic. Declined statin therapy

## 2022-07-06 NOTE — Assessment & Plan Note (Signed)
Chronic.  Previously tried pravastatin but discontinued secondary to worsening myalgias.  Patient not interested in restarting medication.

## 2022-07-06 NOTE — Assessment & Plan Note (Signed)
Chronic.  Continues to remain elevated but has much improved since last visit.  Recommend increasing amlodipine however patient would like to continue current dose for another few weeks. Continue lisinopril 20 mg twice daily Continue carvedilol 25 mg twice daily Continue new amlodipine 5 mg daily Continue Lasix 20 mg daily, taking as needed Continue to monitor blood pressure at home. Follow-up in 8 weeks Strict return precautions provided

## 2022-08-17 ENCOUNTER — Other Ambulatory Visit: Payer: Self-pay | Admitting: Family Medicine

## 2022-08-17 DIAGNOSIS — I10 Essential (primary) hypertension: Secondary | ICD-10-CM

## 2022-08-18 MED ORDER — AMLODIPINE BESYLATE 5 MG PO TABS: 5.0000 mg | ORAL_TABLET | Freq: Every day | ORAL | 3 refills | Status: DC

## 2022-08-22 NOTE — Patient Instructions (Addendum)
It was a pleasure meeting you today. Thank you for allowing me to take part in your health care.  Our goals for today as we discussed include:  We will get some labs today.  If they are abnormal or we need to do something about them, I will call you.  If they are normal, I will send you a message on MyChart (if it is active) or a letter in the mail.  If you don't hear from [place] in 2 weeks, please call the office at the number below.   Increase Lisinopril to 20 mg at two times a day Continue Carvedilol 25 mg once a day.  This was originally ordered to take 2 times a day. Continue Amlodipine 5 mg at night  Continue to check blood pressure at home.  Goal is less than 140/90  Follow up in 6 months   Schedule Medicare Annual Wellness Visit   Recommend Shingles vaccine.  This is a 2 dose series and can be given at your local pharmacy.  Please talk to your pharmacist about this.   Recommend Pneumonia 20 vaccine.  If you have any questions or concerns, please do not hesitate to call the office at [phone].  I look forward to our next visit and until then take care and stay safe.  Regards,   Dana Allan, MD   [facility]

## 2022-08-22 NOTE — Progress Notes (Signed)
   SUBJECTIVE:   Chief Complaint  Patient presents with   Medical Management of Chronic Issues   HPI Patient presents to clinic for follow-up blood pressure management  Hypertension Last seen in clinic 04/15 for hypertension management.  Amlodipine 5 mg daily was added at the time.  He continues to take Coreg 25 mg twice daily and lisinopril 20 mg daily.  He reports blood pressure at home is much improved since starting amlodipine. He is tolerating well but does endorse increased fatigue that is improving.  Denies any headaches, visual changes, chest pain, shortness of breath, lower extremity edema.  Hypothyroid Asymptomatic.  Needing refills for Levothyroxine  CKD BP improved since initiation of CCB.  Does not want referral to Nephrology at this time.  Rechecking labs today as previously discussed and will decide if wanting referral at later date.  PERTINENT PMH / PSH: Hypertension Hypothyroid CKD stage IIIb  OBJECTIVE:  BP (!) 148/88   Pulse 68   Temp 97.9 F (36.6 C)   Resp 16   Ht 5\' 10"  (1.778 m)   Wt 265 lb (120.2 kg)   SpO2 98%   BMI 38.02 kg/m    Physical Exam Vitals reviewed.  Constitutional:      General: He is not in acute distress.    Appearance: He is normal weight. He is not ill-appearing.  HENT:     Head: Normocephalic.  Eyes:     Conjunctiva/sclera: Conjunctivae normal.  Cardiovascular:     Rate and Rhythm: Normal rate.  Pulmonary:     Effort: Pulmonary effort is normal.  Neurological:     Mental Status: He is alert. Mental status is at baseline.  Psychiatric:        Mood and Affect: Mood normal.        Behavior: Behavior normal.        Thought Content: Thought content normal.        Judgment: Judgment normal.     ASSESSMENT/PLAN:  Essential hypertension Assessment & Plan: Chronic.  Continues to remain elevated but has much improved since last visit. Increase Lisinopril 20 mg back to twice daily Continue Carvedilol 25 mg  daily Continue  Amlodipine 5 mg at night Continue Lasix 20 mg daily, taking as needed Continue to monitor blood pressure at home. Strict return precautions provided    Orders: -     Carvedilol; Take 1 tablet (25 mg total) by mouth daily.  Dispense: 90 tablet; Refill: 3 -     Lisinopril; Take 1 tablet (20 mg total) by mouth in the morning and at bedtime.  Dispense: 180 tablet; Refill: 3  Stage 3b chronic kidney disease (HCC) Assessment & Plan: Chronic.  Stable.  GFR decreased from 40.57-36.39. BP controlled improved since initiation of CCB Recheck renal functions    Orders: -     Renal function panel  Hypothyroidism, unspecified type Assessment & Plan: Chronic.  Asymptomatic.  Last TSH normal Refill levothyroxine 75 mcg daily   Orders: -     Levothyroxine Sodium; Take 1 tablet (75 mcg total) by mouth daily before breakfast.  Dispense: 90 tablet; Refill: 3   HCM Recommend pneumonia 20 vaccine Recommend shingles vaccinations Medicare annual wellness visit due.  Patient to schedule appointment.  PDMP reviewed  Return in about 6 months (around 02/23/2023) for PCP.  Dana Allan, MD

## 2022-08-23 ENCOUNTER — Ambulatory Visit: Payer: Medicare PPO | Admitting: Family Medicine

## 2022-08-23 ENCOUNTER — Encounter: Payer: Self-pay | Admitting: Family Medicine

## 2022-08-23 VITALS — BP 148/88 | HR 68 | Temp 97.9°F | Resp 16 | Ht 70.0 in | Wt 265.0 lb

## 2022-08-23 DIAGNOSIS — I10 Essential (primary) hypertension: Secondary | ICD-10-CM

## 2022-08-23 DIAGNOSIS — N1832 Chronic kidney disease, stage 3b: Secondary | ICD-10-CM | POA: Diagnosis not present

## 2022-08-23 DIAGNOSIS — E039 Hypothyroidism, unspecified: Secondary | ICD-10-CM | POA: Diagnosis not present

## 2022-08-23 LAB — RENAL FUNCTION PANEL
Albumin: 4.1 g/dL (ref 3.5–5.2)
BUN: 27 mg/dL — ABNORMAL HIGH (ref 6–23)
CO2: 25 mEq/L (ref 19–32)
Calcium: 10.1 mg/dL (ref 8.4–10.5)
Chloride: 103 mEq/L (ref 96–112)
Creatinine, Ser: 1.77 mg/dL — ABNORMAL HIGH (ref 0.40–1.50)
GFR: 36.52 mL/min — ABNORMAL LOW (ref >60.00)
Glucose, Bld: 91 mg/dL (ref 70–99)
Phosphorus: 3.7 mg/dL (ref 2.3–4.6)
Potassium: 4.3 mEq/L (ref 3.5–5.1)
Sodium: 137 mEq/L (ref 135–145)

## 2022-08-23 MED ORDER — LEVOTHYROXINE SODIUM 75 MCG PO TABS
75.0000 ug | ORAL_TABLET | Freq: Every day | ORAL | 3 refills | Status: DC
Start: 2022-08-23 — End: 2023-07-18

## 2022-08-23 MED ORDER — LISINOPRIL 20 MG PO TABS
20.0000 mg | ORAL_TABLET | Freq: Two times a day (BID) | ORAL | 3 refills | Status: DC
Start: 2022-08-23 — End: 2023-07-18

## 2022-08-23 MED ORDER — CARVEDILOL 25 MG PO TABS: 25.0000 mg | ORAL_TABLET | Freq: Every day | ORAL | 3 refills | Status: DC

## 2022-08-23 NOTE — Assessment & Plan Note (Signed)
Chronic.  Asymptomatic.  Last TSH normal Refill levothyroxine 75 mcg daily

## 2022-08-23 NOTE — Assessment & Plan Note (Signed)
Chronic.  Stable.  GFR decreased from 40.57-36.39. BP controlled improved since initiation of CCB Recheck renal functions

## 2022-08-23 NOTE — Assessment & Plan Note (Addendum)
Chronic.  Continues to remain elevated but has much improved since last visit. Increase Lisinopril 20 mg back to twice daily Continue Carvedilol 25 mg  daily Continue Amlodipine 5 mg at night Continue Lasix 20 mg daily, taking as needed Continue to monitor blood pressure at home. Strict return precautions provided

## 2022-08-30 ENCOUNTER — Encounter: Payer: Self-pay | Admitting: Family Medicine

## 2022-10-15 ENCOUNTER — Encounter: Payer: Self-pay | Admitting: Family Medicine

## 2022-10-15 ENCOUNTER — Other Ambulatory Visit: Payer: Self-pay

## 2022-10-15 DIAGNOSIS — E039 Hypothyroidism, unspecified: Secondary | ICD-10-CM

## 2022-10-15 NOTE — Telephone Encounter (Signed)
Called pt and advise that he has refills on file at the pharmacy.

## 2022-10-15 NOTE — Telephone Encounter (Signed)
Error

## 2023-01-25 ENCOUNTER — Other Ambulatory Visit: Payer: Medicare PPO

## 2023-02-01 ENCOUNTER — Encounter: Payer: Medicare PPO | Admitting: Family Medicine

## 2023-02-23 ENCOUNTER — Ambulatory Visit: Payer: Medicare PPO | Admitting: Family Medicine

## 2023-02-23 ENCOUNTER — Encounter: Payer: Self-pay | Admitting: Family Medicine

## 2023-02-23 VITALS — BP 138/84 | HR 66 | Temp 97.9°F | Resp 18 | Ht 70.0 in | Wt 271.5 lb

## 2023-02-23 DIAGNOSIS — N1832 Chronic kidney disease, stage 3b: Secondary | ICD-10-CM | POA: Diagnosis not present

## 2023-02-23 DIAGNOSIS — I10 Essential (primary) hypertension: Secondary | ICD-10-CM

## 2023-02-23 LAB — COMPREHENSIVE METABOLIC PANEL
ALT: 22 U/L (ref 0–53)
AST: 20 U/L (ref 0–37)
Albumin: 4.1 g/dL (ref 3.5–5.2)
Alkaline Phosphatase: 107 U/L (ref 39–117)
BUN: 24 mg/dL — ABNORMAL HIGH (ref 6–23)
CO2: 29 meq/L (ref 19–32)
Calcium: 9.3 mg/dL (ref 8.4–10.5)
Chloride: 104 meq/L (ref 96–112)
Creatinine, Ser: 1.69 mg/dL — ABNORMAL HIGH (ref 0.40–1.50)
GFR: 38.47 mL/min — ABNORMAL LOW (ref >60.00)
Glucose, Bld: 102 mg/dL — ABNORMAL HIGH (ref 70–99)
Potassium: 4.1 meq/L (ref 3.5–5.1)
Sodium: 139 meq/L (ref 135–145)
Total Bilirubin: 0.7 mg/dL (ref 0.2–1.2)
Total Protein: 7.7 g/dL (ref 6.0–8.3)

## 2023-02-23 MED ORDER — METOPROLOL TARTRATE 25 MG PO TABS: 25.0000 mg | ORAL_TABLET | Freq: Two times a day (BID) | ORAL | 0 refills | Status: DC

## 2023-02-23 NOTE — Progress Notes (Signed)
SUBJECTIVE:   Chief Complaint  Patient presents with   Medical Management of Chronic Issues    6 month follow up     HPI Follow up for chronic disease management  Discussed the use of AI scribe software for clinical note transcription with the patient, who gave verbal consent to proceed.  History of Present Illness The patient, with a history of hypertension, has been monitoring their blood pressure at home and reports it has been "pretty decent." They noted a reading of 123/78 about a week ago. They have been taking amlodipine, lisinopril, and carvedilol, which they believe have contributed to the improved blood pressure control. However, they reported that they initially discontinued lisinopril as they felt it was causing an increase in their blood pressure. They experimented with their medication regimen, adjusting the carvedilol dosage, but found that increasing it was too much. They are currently taking carvedilol and lisinopril in the morning and amlodipine and lisinopril in the evening.  The patient also reported some swelling about a month ago, for which they took furosemide (Lasix) on an as-needed basis, but not daily. They did not notice any significant change in their blood pressure when taking the Lasix.  The patient has been experiencing sinus congestion, which they attribute to the carvedilol. They described it as a stuffy nose that clears out over time, but it is persistent. They previously took metoprolol, which they did not associate with the sinus congestion. They expressed interest in trying metoprolol again to see if it alleviates the sinus symptoms.  The patient's kidney function has been declining, with their levels currently in the thirties. They have been advised to increase their fluid intake. They have declined referral to a nephrologist in the past, but it was suggested that if their kidney function continues to decline, a nephrologist should be involved.       PERTINENT PMH / PSH: As above  OBJECTIVE:  BP 138/84   Pulse 66   Temp 97.9 F (36.6 C)   Resp 18   Ht 5\' 10"  (1.778 m)   Wt 271 lb 8 oz (123.2 kg)   SpO2 96%   BMI 38.96 kg/m    Physical Exam Vitals reviewed.  Constitutional:      General: He is not in acute distress.    Appearance: Normal appearance. He is obese. He is not ill-appearing, toxic-appearing or diaphoretic.  Eyes:     General:        Right eye: No discharge.        Left eye: No discharge.  Cardiovascular:     Rate and Rhythm: Normal rate and regular rhythm.     Heart sounds: Normal heart sounds.  Pulmonary:     Effort: Pulmonary effort is normal.     Breath sounds: Normal breath sounds.  Musculoskeletal:        General: Normal range of motion.     Cervical back: Normal range of motion.  Skin:    General: Skin is warm and dry.  Neurological:     Mental Status: He is alert and oriented to person, place, and time. Mental status is at baseline.  Psychiatric:        Mood and Affect: Mood normal.        Behavior: Behavior normal.        Thought Content: Thought content normal.        Judgment: Judgment normal.           03/02/2023   10:59 AM  02/23/2023   12:57 PM 05/24/2022    1:35 PM 01/26/2022    2:48 PM 10/27/2021    2:06 PM  Depression screen PHQ 2/9  Decreased Interest 0 0 0 0 0  Down, Depressed, Hopeless 0 0 0 0 0  PHQ - 2 Score 0 0 0 0 0  Altered sleeping 0 0  0   Tired, decreased energy 0 0  0   Change in appetite 0 0  0   Feeling bad or failure about yourself  0 0  0   Trouble concentrating 0 0  0   Moving slowly or fidgety/restless 0 0  0   Suicidal thoughts 0 0  0   PHQ-9 Score 0 0  0   Difficult doing work/chores Not difficult at all Not difficult at all  Not difficult at all       03/02/2023   10:59 AM 02/23/2023   12:57 PM 05/24/2022    1:35 PM 01/26/2022    2:49 PM  GAD 7 : Generalized Anxiety Score  Nervous, Anxious, on Edge 0 0 0 0  Control/stop worrying 0 0 0 0  Worry  too much - different things 0 0 0 0  Trouble relaxing 0 0 0 0  Restless 0 0 0 0  Easily annoyed or irritable 0 0 0 0  Afraid - awful might happen 0 0 0 0  Total GAD 7 Score 0 0 0 0  Anxiety Difficulty Not difficult at all Not difficult at all Not difficult at all Not difficult at all    ASSESSMENT/PLAN:  Essential hypertension Assessment & Plan: Well controlled on current regimen of Amlodipine, Lisinopril, and Carvedilol. Patient reports sinus congestion potentially related to Carvedilol. Discussed the risks/benefits of switching to Metoprolol. -Discontinue Carvedilol 25 mg daily and start Metoprolol Tartrate 25mg  BID. -Monitor blood pressure at home and adjust dose as needed.   Stage 3b chronic kidney disease (HCC) Assessment & Plan: Last kidney function in the 30s. Discussed the importance of hydration and potential need for nephrology involvement if kidney function continues to decline. -Check kidney function today. -Consider nephrology referral if kidney function remains in the 30s.  Orders: -     Comprehensive metabolic panel   PDMP reviewed  Return in about 1 week (around 03/02/2023) for PCP, HTN.  Dana Allan, MD

## 2023-02-23 NOTE — Patient Instructions (Signed)
 It was a pleasure meeting you today. Thank you for allowing me to take part in your health care.  Our goals for today as we discussed include:  Stop Carvedilol   Start Metoprolol  IR 25 mg two times a day If heart rate less than 60 do not take medication  Monitor blood pressure.    Follow up in 1 week  We will get some labs today.  If they are abnormal or we need to do something about them, I will call you.  If they are normal, I will send you a message on MyChart (if it is active) or a letter in the mail.  If you don't hear from us  in 2 weeks, please call the office at the number below.    This is a list of the screening recommended for you and due dates:  Health Maintenance  Topic Date Due   Medicare Annual Wellness Visit  Never done   Pneumonia Vaccine (1 of 2 - PCV) Never done   Zoster (Shingles) Vaccine (1 of 2) Never done   Flu Shot  09/09/2022   COVID-19 Vaccine (6 - 2024-25 season) 10/10/2022   Hepatitis C Screening  Completed   HPV Vaccine  Aged Out   Screening for Lung Cancer  Discontinued   DTaP/Tdap/Td vaccine  Discontinued      If you have any questions or concerns, please do not hesitate to call the office at [phone].  I look forward to our next visit and until then take care and stay safe.  Regards,   Valli Gaw, MD   [facility]

## 2023-02-24 NOTE — Telephone Encounter (Signed)
FYI

## 2023-02-25 ENCOUNTER — Encounter: Payer: Self-pay | Admitting: Family Medicine

## 2023-03-02 ENCOUNTER — Ambulatory Visit: Payer: Medicare PPO | Admitting: Family Medicine

## 2023-03-02 ENCOUNTER — Encounter: Payer: Self-pay | Admitting: Family Medicine

## 2023-03-02 VITALS — BP 142/82 | HR 86 | Temp 98.0°F | Resp 18 | Ht 70.0 in | Wt 268.2 lb

## 2023-03-02 DIAGNOSIS — N1832 Chronic kidney disease, stage 3b: Secondary | ICD-10-CM

## 2023-03-02 DIAGNOSIS — I10 Essential (primary) hypertension: Secondary | ICD-10-CM

## 2023-03-02 MED ORDER — CARVEDILOL 25 MG PO TABS
25.0000 mg | ORAL_TABLET | Freq: Every day | ORAL | 3 refills | Status: DC
Start: 2023-03-02 — End: 2023-07-18

## 2023-03-02 NOTE — Patient Instructions (Signed)
It was a pleasure meeting you today. Thank you for allowing me to take part in your health care.  Our goals for today as we discussed include:  Stop Metoprolol Restart Carvedilol 25 mg daily  Monitor blood pressure at home.  Goal <140/90   This is a list of the screening recommended for you and due dates:  Health Maintenance  Topic Date Due   Medicare Annual Wellness Visit  Never done   Pneumonia Vaccine (1 of 2 - PCV) Never done   Zoster (Shingles) Vaccine (1 of 2) Never done   Flu Shot  09/09/2022   COVID-19 Vaccine (6 - 2024-25 season) 10/10/2022   Hepatitis C Screening  Completed   HPV Vaccine  Aged Out   Screening for Lung Cancer  Discontinued   DTaP/Tdap/Td vaccine  Discontinued    Follow up in 6 months   If you have any questions or concerns, please do not hesitate to call the office at [phone].  I look forward to our next visit and until then take care and stay safe.  Regards,   Dana Allan, MD   [facility]

## 2023-03-02 NOTE — Progress Notes (Signed)
SUBJECTIVE:   Chief Complaint  Patient presents with   Hypertension    Started a new medication 1 week ago   HPI Presents for follow up hypertension  Discussed the use of AI scribe software for clinical note transcription with the patient, who gave verbal consent to proceed.  History of Present Illness The patient, with a history of hypertension, presents with concerns about their blood pressure control. They report that their home readings have been around 140/80, which they perceive as higher than when they were on carvedilol. They have been on metoprolol for a week and have noticed higher systolic readings compared to when they were on carvedilol. They also report sinus issues since starting metoprolol. The patient expresses a desire to switch back to carvedilol, which they took once a day, along with amlodipine in the evening and lisinopril morning and night. They deny any chest pain or shortness of breath.      PERTINENT PMH / PSH: As above  OBJECTIVE:  BP (!) 142/82   Pulse 86   Temp 98 F (36.7 C)   Resp 18   Ht 5\' 10"  (1.778 m)   Wt 268 lb 4 oz (121.7 kg)   SpO2 96%   BMI 38.49 kg/m    Physical Exam Vitals reviewed.  Constitutional:      General: He is not in acute distress.    Appearance: Normal appearance. He is normal weight. He is not ill-appearing, toxic-appearing or diaphoretic.  Eyes:     General:        Right eye: No discharge.        Left eye: No discharge.  Cardiovascular:     Rate and Rhythm: Normal rate and regular rhythm.     Heart sounds: Normal heart sounds.  Pulmonary:     Effort: Pulmonary effort is normal.     Breath sounds: Normal breath sounds.  Abdominal:     General: Bowel sounds are normal.  Musculoskeletal:        General: Normal range of motion.     Cervical back: Normal range of motion.  Skin:    General: Skin is warm and dry.  Neurological:     Mental Status: He is alert and oriented to person, place, and time. Mental  status is at baseline.  Psychiatric:        Mood and Affect: Mood normal.        Behavior: Behavior normal.        Thought Content: Thought content normal.        Judgment: Judgment normal.           03/02/2023   10:59 AM 02/23/2023   12:57 PM 05/24/2022    1:35 PM 01/26/2022    2:48 PM 10/27/2021    2:06 PM  Depression screen PHQ 2/9  Decreased Interest 0 0 0 0 0  Down, Depressed, Hopeless 0 0 0 0 0  PHQ - 2 Score 0 0 0 0 0  Altered sleeping 0 0  0   Tired, decreased energy 0 0  0   Change in appetite 0 0  0   Feeling bad or failure about yourself  0 0  0   Trouble concentrating 0 0  0   Moving slowly or fidgety/restless 0 0  0   Suicidal thoughts 0 0  0   PHQ-9 Score 0 0  0   Difficult doing work/chores Not difficult at all Not difficult at all  Not difficult at  all       03/02/2023   10:59 AM 02/23/2023   12:57 PM 05/24/2022    1:35 PM 01/26/2022    2:49 PM  GAD 7 : Generalized Anxiety Score  Nervous, Anxious, on Edge 0 0 0 0  Control/stop worrying 0 0 0 0  Worry too much - different things 0 0 0 0  Trouble relaxing 0 0 0 0  Restless 0 0 0 0  Easily annoyed or irritable 0 0 0 0  Afraid - awful might happen 0 0 0 0  Total GAD 7 Score 0 0 0 0  Anxiety Difficulty Not difficult at all Not difficult at all Not difficult at all Not difficult at all    ASSESSMENT/PLAN:  Essential hypertension Assessment & Plan: Blood pressure readings at home around 140/80. Patient reports preference for carvedilol over metoprolol due to perceived better control of systolic blood pressure. -Discontinue metoprolol. -Restart carvedilol 25mg  once daily. -Continue amlodipine in the evening and lisinopril morning and night as previously prescribed. -Recheck blood pressure at next visit.   Orders: -     Carvedilol; Take 1 tablet (25 mg total) by mouth daily.  Dispense: 90 tablet; Refill: 3  Stage 3b chronic kidney disease [facility]) Assessment & Plan: Patient declined Nephrology  referral     PDMP reviewed  Return in about 6 months (around 08/30/2023) for PCP, HTN.  Dana Allan, MD

## 2023-03-06 ENCOUNTER — Encounter: Payer: Self-pay | Admitting: Family Medicine

## 2023-03-06 NOTE — Assessment & Plan Note (Signed)
Last kidney function in the 30s. Discussed the importance of hydration and potential need for nephrology involvement if kidney function continues to decline. -Check kidney function today. -Consider nephrology referral if kidney function remains in the 30s.

## 2023-03-06 NOTE — Assessment & Plan Note (Signed)
Well controlled on current regimen of Amlodipine, Lisinopril, and Carvedilol. Patient reports sinus congestion potentially related to Carvedilol. Discussed the risks/benefits of switching to Metoprolol. -Discontinue Carvedilol 25 mg daily and start Metoprolol Tartrate 25mg  BID. -Monitor blood pressure at home and adjust dose as needed.

## 2023-03-13 ENCOUNTER — Encounter: Payer: Self-pay | Admitting: Family Medicine

## 2023-03-13 NOTE — Assessment & Plan Note (Signed)
Patient declined Nephrology referral

## 2023-03-13 NOTE — Assessment & Plan Note (Signed)
Blood pressure readings at home around 140/80. Patient reports preference for carvedilol over metoprolol due to perceived better control of systolic blood pressure. -Discontinue metoprolol. -Restart carvedilol 25mg  once daily. -Continue amlodipine in the evening and lisinopril morning and night as previously prescribed. -Recheck blood pressure at next visit.

## 2023-07-18 ENCOUNTER — Encounter: Payer: Self-pay | Admitting: Family Medicine

## 2023-07-18 ENCOUNTER — Ambulatory Visit: Admitting: Family Medicine

## 2023-07-18 VITALS — BP 162/93 | HR 68 | Temp 97.6°F | Resp 18 | Ht 70.0 in | Wt 270.0 lb

## 2023-07-18 DIAGNOSIS — I5032 Chronic diastolic (congestive) heart failure: Secondary | ICD-10-CM | POA: Diagnosis not present

## 2023-07-18 DIAGNOSIS — E782 Mixed hyperlipidemia: Secondary | ICD-10-CM | POA: Diagnosis not present

## 2023-07-18 DIAGNOSIS — I10 Essential (primary) hypertension: Secondary | ICD-10-CM

## 2023-07-18 DIAGNOSIS — I7121 Aneurysm of the ascending aorta, without rupture: Secondary | ICD-10-CM

## 2023-07-18 DIAGNOSIS — R7303 Prediabetes: Secondary | ICD-10-CM | POA: Diagnosis not present

## 2023-07-18 DIAGNOSIS — E039 Hypothyroidism, unspecified: Secondary | ICD-10-CM

## 2023-07-18 DIAGNOSIS — R251 Tremor, unspecified: Secondary | ICD-10-CM | POA: Diagnosis not present

## 2023-07-18 DIAGNOSIS — N1832 Chronic kidney disease, stage 3b: Secondary | ICD-10-CM | POA: Diagnosis not present

## 2023-07-18 DIAGNOSIS — Z77098 Contact with and (suspected) exposure to other hazardous, chiefly nonmedicinal, chemicals: Secondary | ICD-10-CM | POA: Diagnosis not present

## 2023-07-18 MED ORDER — LISINOPRIL 20 MG PO TABS
20.0000 mg | ORAL_TABLET | Freq: Two times a day (BID) | ORAL | 3 refills | Status: AC
Start: 2023-07-18 — End: ?

## 2023-07-18 MED ORDER — AMLODIPINE BESYLATE 5 MG PO TABS
5.0000 mg | ORAL_TABLET | Freq: Every day | ORAL | 3 refills | Status: DC
Start: 2023-07-18 — End: 2023-08-01

## 2023-07-18 MED ORDER — CARVEDILOL 25 MG PO TABS
25.0000 mg | ORAL_TABLET | Freq: Every day | ORAL | 3 refills | Status: AC
Start: 2023-07-18 — End: ?

## 2023-07-18 MED ORDER — FUROSEMIDE 20 MG PO TABS
ORAL_TABLET | ORAL | 3 refills | Status: AC
Start: 2023-07-18 — End: ?

## 2023-07-18 MED ORDER — LEVOTHYROXINE SODIUM 75 MCG PO TABS
75.0000 ug | ORAL_TABLET | Freq: Every day | ORAL | 3 refills | Status: AC
Start: 2023-07-18 — End: ?

## 2023-07-18 NOTE — Progress Notes (Signed)
 New Patient Office Visit  Subjective    Patient ID: Logan [name], male    DOB: 01/06/1946  Age: 77 y.o. MRN: [id number]  CC:  Chief Complaint  Patient presents with   Establish Care   stiffness   Tremors    Left arm sometimes    HPI Logan [name] presents to establish care.  Delightful 78 year old gentleman establishing care.  He has an aortic aneurysm, CAD (s/p 3-V CABG 10/28/2020), CVA, chronic diastolic heart failure, HTN, mixed hyperlipidemia, hypothyroidism, myalgias to statins, borderline DMT2, stage IIIb CKD (creatinine 1.69, sister with ESRD), previous smoker, vitamin D  deficiency.      Patient has hypertension and is currently taking carvedilol  25 mg once daily, lisinopril  20 mg twice daily, amlodipine  5 mg nightly and furosemide  20 mg occasionally for swelling in his left foot.  He is not on a potassium supplement.  Apparently patient experiments with his blood pressure medicine to see what he likes the best.  He does check his blood pressure at home and gets anywhere between 120/70 to 170/85.  His baseline creatinine is 1.69 and he is on lisinopril  20 mg twice daily.  He reports his sister has ESRD.      09/04/2021 TTE: LVEF 55 to 60%, NRWMA, LV cavity size is mildly dilated and normal function.  No ventricular hypertrophy, Grade 1 DD.  Right ventricle is normal.  No valvular problems.  Borderline dilatation of the ascending aorta measuring 38 mm. He reports he had a stroke around the time he had his CABG surgery.  He reports he has had 2 CVAs.  09/29/2021 MRI of the brain possible small subacute subcortical white matter infarct in the left frontal lobe, moderate chronic small vessel ischemic disease.  He does not take statins because they cause him to have myalgias.        He had been taking 81 mg aspirin  but has stopped this now.  He does take a multivitamin daily but no extra vitamin D . He has tremendous stiffness in his back when he transitions from sitting to standing and  he also has a tremor in his left arm that comes and goes.  He like to be evaluated for Parkinson's disease.         He has significant back pain.  He is very [name] when he goes from seated to standing and he has difficulty walking in stores because his back hurts so badly.  He can sit down for 2 minutes and the pain gets better and then he can start walking again.  He complains he cannot mow his yard because of his back pain.  He does not take NSAIDs for his back.  If he gets an account of gout he will take NSAIDs but that is the only reason he takes NSAIDs.      He quit smoking in 2011 and was a heavy smoker at about a pack and a half a day for at least 20 years.  His pack-year history is approximately 30+.  He does not drink alcohol.  Does not use drugs.      He reports he had a nocturnal pain that seem to come up from his hip up to the left side of his chest.  It prevented him from sleeping.  He did not go to the hospital but took two 325 mg aspirins instead.   Outpatient Encounter Medications as of 07/18/2023  Medication Sig   [DISCONTINUED] amLODipine  (NORVASC ) 5 MG tablet Take 1 tablet (  5 mg total) by mouth daily.   [DISCONTINUED] carvedilol  (COREG ) 25 MG tablet Take 1 tablet (25 mg total) by mouth daily.   [DISCONTINUED] furosemide  (LASIX ) 20 MG tablet Qd in am   [DISCONTINUED] levothyroxine  (EUTHYROX ) 75 MCG tablet Take 1 tablet (75 mcg total) by mouth daily before breakfast.   [DISCONTINUED] lisinopril  (ZESTRIL ) 20 MG tablet Take 1 tablet (20 mg total) by mouth in the morning and at bedtime.   amLODipine  (NORVASC ) 5 MG tablet Take 1 tablet (5 mg total) by mouth daily.   carvedilol  (COREG ) 25 MG tablet Take 1 tablet (25 mg total) by mouth daily.   furosemide  (LASIX ) 20 MG tablet Qd in am   levothyroxine  (EUTHYROX ) 75 MCG tablet Take 1 tablet (75 mcg total) by mouth daily before breakfast.   lisinopril  (ZESTRIL ) 20 MG tablet Take 1 tablet (20 mg total) by mouth in the morning and at bedtime.    No facility-administered encounter medications on file as of 07/18/2023.    Past Medical History:  Diagnosis Date   Bilateral leg edema 09/09/2017   CHEST PAIN UNSPECIFIED 11/06/2009   Qualifier: Diagnosis of   By: Audery Blazing, MD, Penney Bowling      Chronic gout involving toe of left foot without tophus 05/08/2020   CKD (chronic kidney disease), stage III (HCC)    Coronary artery disease    s/p cabg triple bypass in 2011    DNR (do not resuscitate) discussion 03/17/2021   Elevated PSA    Elevated PSA 01/23/2018   Elevated TSH    Elevated TSH 09/09/2017   Enlarged prostate    Fatigue 12/09/2009   Qualifier: Diagnosis of   By: Audery Blazing, MD, Penney Bowling      History of influenza    Hyperlipidemia    Hypertension    Low back pain 09/09/2017   Xray 09/2017 DDD and arthritis changes      Morbid obesity (HCC) 04/11/2015   Stasis dermatitis of both legs 05/08/2020   Stroke [facility])     Past Surgical History:  Procedure Laterality Date   CORONARY ARTERY BYPASS GRAFT     x 3 vessel   triple bypass  2011    Family History  Problem Relation Age of Onset   Heart attack Mother    Heart disease Mother    Heart disease Father    Diabetes Sister        type 2    Kidney disease Sister    Kidney failure Sister        stage 5 in 14s as of 09/2020   Stroke Maternal Grandmother    Heart attack Maternal Grandfather    Stroke Maternal Grandfather    Heart disease Maternal Grandfather    Heart attack Paternal Grandmother    Heart disease Paternal Grandmother    Stroke Paternal Grandfather    Diabetes Son        type 1   Diabetes Grandchild        type 1    Social History   Socioeconomic History   Marital status: Married    Spouse name: Not on file   Number of children: Not on file   Years of education: Not on file   Highest education level: Not on file  Occupational History   Not on file  Tobacco Use   Smoking status: Former    Current packs/day: 1.50     Average packs/day: 1.5 packs/day for 30.0 years (45.0 ttl pk-yrs)    Types:  Cigarettes    Passive exposure: Past   Smokeless tobacco: Never  Vaping Use   Vaping status: Never Used  Substance and Sexual Activity   Alcohol use: No   Drug use: No   Sexual activity: Not Currently  Other Topics Concern   Not on file  Social History Narrative   Married    BS degree chemistry    Kids 53 y.o son    Owns guns, wears seat belt, safe in relationship    Social Drivers of Corporate investment banker Strain: Not on file  Food Insecurity: Not on file  Transportation Needs: Not on file  Physical Activity: Not on file  Stress: Not on file  Social Connections: Not on file  Intimate Partner Violence: Not on file    ROS      Objective   BP (!) 162/93   Pulse 68   Temp 97.6 F (36.4 C) (Oral)   Resp 18   Ht 5\' 10"  (1.778 m)   Wt 270 lb (122.5 kg)   SpO2 95%   BMI 38.74 kg/m    Physical Exam Vitals and nursing note reviewed.  Constitutional:      Appearance: Normal appearance.  HENT:     Head: Normocephalic and atraumatic.  Eyes:     Conjunctiva/sclera: Conjunctivae normal.  Cardiovascular:     Rate and Rhythm: Normal rate and regular rhythm.  Pulmonary:     Effort: Pulmonary effort is normal.     Breath sounds: Normal breath sounds.  Musculoskeletal:     Right lower leg: No edema.     Left lower leg: No edema.  Skin:    General: Skin is warm and dry.  Neurological:     Mental Status: He is alert and oriented to person, place, and time.  Psychiatric:        Mood and Affect: Mood normal.        Behavior: Behavior normal.        Thought Content: Thought content normal.        Judgment: Judgment normal.            The ASCVD Risk score (Arnett DK, et al., 2019) failed to calculate for the following reasons:   Risk score cannot be calculated because patient has a medical history suggesting prior/existing ASCVD     Assessment & Plan:  Borderline diabetes -      Hemoglobin A1c  Essential hypertension Assessment & Plan: He is on an odd regimen for blood pressure.  He has carvedilol  25 mg and only takes it once a day.  Takes lisinopril  20 mg twice daily and amlodipine  5 mg in the evenings.  Has furosemide  20 mg that he takes when he has peripheral edema.  Have encouraged him to check his blood pressure and pulse in the morning and the evening, am concerned he may have rebound hypertension and rebound tachycardia because the Coreg  is only once a day.  Discussed referral to nephrology to help with his blood pressure and especially since his sister has end-stage renal disease but he declines the referral  Orders: -     amLODIPine  Besylate; Take 1 tablet (5 mg total) by mouth daily.  Dispense: 90 tablet; Refill: 3 -     Carvedilol ; Take 1 tablet (25 mg total) by mouth daily.  Dispense: 90 tablet; Refill: 3 -     Lisinopril ; Take 1 tablet (20 mg total) by mouth in the morning and at bedtime.  Dispense: 180 tablet;  Refill: 3 -     CBC with Differential/Platelet -     Comprehensive metabolic panel with GFR -     Lipid panel -     TSH + free T4; Future  Primary hypertension -     Furosemide ; Qd in am  Dispense: 90 tablet; Refill: 3 -     Microalbumin / creatinine urine ratio  Hypothyroidism, unspecified type -     Levothyroxine  Sodium; Take 1 tablet (75 mcg total) by mouth daily before breakfast.  Dispense: 90 tablet; Refill: 3 -     TSH + free T4; Future  Chronic diastolic heart failure [facility]) Assessment & Plan: 09/04/2021 TTE: LVEF 55 to 60%, NRWMA, LV cavity size is mildly dilated and normal function.  No ventricular hypertrophy, Grade 1 DD.  Right ventricle is normal.  No valvular problems.  Borderline dilatation of the ascending aorta measuring 38 mm.   Aneurysm of ascending aorta without rupture [facility]) Assessment & Plan: 09/04/2021 TTE: LVEF 55 to 60%, NRWMA, LV cavity size is mildly dilated and normal function.  No ventricular hypertrophy, Grade 1  DD.  Right ventricle is normal.  No valvular problems.  Borderline dilatation of the ascending aorta measuring 38 mm.   Tremor of left hand Assessment & Plan: He is concerned he has Parkinson's disease.  Advised that would involve a referral to neurology and possibly a CT scan of the brain.  He declines referral for now.   Prediabetes Assessment & Plan: Reports he has borderline diabetes.  Checking his A1c today.   Stage 3b chronic kidney disease (HCC) Assessment & Plan: Have advised that lisinopril  20 mg twice a day may be worsening his renal function.  Have advised he needs to change his blood pressure medicine and come off lisinopril  or at least go to a much lower dose.  He is adamant he wants to stay on lisinopril  because he has good blood pressure control.   Mixed hyperlipidemia Assessment & Plan: Checking his fasting lipid profile.  He will not take statins.  Discussed Zetia with him today and advised that it is not a statin and is not associated with myalgias but he still declines a prescription.   Agent orange exposure Assessment & Plan: He was in the [place] War and has been exposed to agent orange.  He was notified by the [place].     Return in about 10 days (around 07/28/2023).   Candelaria Pies K Floyed Masoud, MD

## 2023-07-18 NOTE — Assessment & Plan Note (Signed)
 Checking his fasting lipid profile.  He will not take statins.  Discussed Zetia with him today and advised that it is not a statin and is not associated with myalgias but he still declines a prescription.

## 2023-07-18 NOTE — Assessment & Plan Note (Signed)
 He is concerned he has Parkinson's disease.  Advised that would involve a referral to neurology and possibly a CT scan of the brain.  He declines referral for now.

## 2023-07-18 NOTE — Assessment & Plan Note (Signed)
 He is on an odd regimen for blood pressure.  He has carvedilol  25 mg and only takes it once a day.  Takes lisinopril  20 mg twice daily and amlodipine  5 mg in the evenings.  Has furosemide  20 mg that he takes when he has peripheral edema.  Have encouraged him to check his blood pressure and pulse in the morning and the evening, am concerned he may have rebound hypertension and rebound tachycardia because the Coreg  is only once a day.  Discussed referral to nephrology to help with his blood pressure and especially since his sister has end-stage renal disease but he declines the referral

## 2023-07-18 NOTE — Assessment & Plan Note (Signed)
 He was in the [place] War and has been exposed to agent orange.  He was notified by the [place].

## 2023-07-18 NOTE — Assessment & Plan Note (Signed)
 Reports he has borderline diabetes.  Checking his A1c today.

## 2023-07-18 NOTE — Assessment & Plan Note (Signed)
 09/04/2021 TTE: LVEF 55 to 60%, NRWMA, LV cavity size is mildly dilated and normal function.  No ventricular hypertrophy, Grade 1 DD.  Right ventricle is normal.  No valvular problems.  Borderline dilatation of the ascending aorta measuring 38 mm.

## 2023-07-18 NOTE — Assessment & Plan Note (Signed)
 Have advised that lisinopril  20 mg twice a day may be worsening his renal function.  Have advised he needs to change his blood pressure medicine and come off lisinopril  or at least go to a much lower dose.  He is adamant he wants to stay on lisinopril  because he has good blood pressure control.

## 2023-07-19 ENCOUNTER — Ambulatory Visit: Payer: Self-pay | Admitting: Family Medicine

## 2023-07-19 LAB — CBC WITH DIFFERENTIAL/PLATELET
Basophils Absolute: 0.1 10*3/uL (ref 0.0–0.2)
Basos: 1 %
EOS (ABSOLUTE): 0.3 10*3/uL (ref 0.0–0.4)
Eos: 3 %
Hematocrit: 41.6 % (ref 37.5–51.0)
Hemoglobin: 13.3 g/dL (ref 13.0–17.7)
Immature Grans (Abs): 0.1 10*3/uL (ref 0.0–0.1)
Immature Granulocytes: 1 %
Lymphocytes Absolute: 1.4 10*3/uL (ref 0.7–3.1)
Lymphs: 14 %
MCH: 28.9 pg (ref 26.6–33.0)
MCHC: 32 g/dL (ref 31.5–35.7)
MCV: 90 fL (ref 79–97)
Monocytes Absolute: 0.7 10*3/uL (ref 0.1–0.9)
Monocytes: 7 %
Neutrophils Absolute: 7.4 10*3/uL — ABNORMAL HIGH (ref 1.4–7.0)
Neutrophils: 74 %
Platelets: 265 10*3/uL (ref 150–450)
RBC: 4.6 x10E6/uL (ref 4.14–5.80)
RDW: 13.9 % (ref 11.6–15.4)
WBC: 10 10*3/uL (ref 3.4–10.8)

## 2023-07-19 LAB — MICROALBUMIN / CREATININE URINE RATIO
Creatinine, Urine: 107.9 mg/dL
Microalb/Creat Ratio: 673 mg/g{creat} — ABNORMAL HIGH (ref 0–29)
Microalbumin, Urine: 726.5 ug/mL

## 2023-07-19 LAB — COMPREHENSIVE METABOLIC PANEL WITH GFR
ALT: 15 IU/L (ref 0–44)
AST: 15 IU/L (ref 0–40)
Albumin: 4.1 g/dL (ref 3.8–4.8)
Alkaline Phosphatase: 128 IU/L — ABNORMAL HIGH (ref 44–121)
BUN/Creatinine Ratio: 14 (ref 10–24)
BUN: 27 mg/dL (ref 8–27)
Bilirubin Total: 0.6 mg/dL (ref 0.0–1.2)
CO2: 23 mmol/L (ref 20–29)
Calcium: 9.4 mg/dL (ref 8.6–10.2)
Chloride: 102 mmol/L (ref 96–106)
Creatinine, Ser: 1.96 mg/dL — ABNORMAL HIGH (ref 0.76–1.27)
Globulin, Total: 3 g/dL (ref 1.5–4.5)
Glucose: 105 mg/dL — ABNORMAL HIGH (ref 70–99)
Potassium: 4.7 mmol/L (ref 3.5–5.2)
Sodium: 140 mmol/L (ref 134–144)
Total Protein: 7.1 g/dL (ref 6.0–8.5)
eGFR: 34 mL/min/{1.73_m2} — ABNORMAL LOW (ref >59)

## 2023-07-19 LAB — HEMOGLOBIN A1C
Est. average glucose Bld gHb Est-mCnc: 111 mg/dL
Hgb A1c MFr Bld: 5.5 % (ref 4.8–5.6)

## 2023-07-19 LAB — LIPID PANEL
Chol/HDL Ratio: 6.2 ratio — ABNORMAL HIGH (ref 0.0–5.0)
Cholesterol, Total: 204 mg/dL — ABNORMAL HIGH (ref 100–199)
HDL: 33 mg/dL — ABNORMAL LOW (ref >39)
LDL Chol Calc (NIH): 128 mg/dL — ABNORMAL HIGH (ref 0–99)
Triglycerides: 243 mg/dL — ABNORMAL HIGH (ref 0–149)
VLDL Cholesterol Cal: 43 mg/dL — ABNORMAL HIGH (ref 5–40)

## 2023-08-01 ENCOUNTER — Encounter: Payer: Self-pay | Admitting: Family Medicine

## 2023-08-01 ENCOUNTER — Ambulatory Visit: Admitting: Family Medicine

## 2023-08-01 VITALS — BP 174/91 | HR 72 | Temp 98.2°F | Ht 70.0 in | Wt 269.0 lb

## 2023-08-01 DIAGNOSIS — N1832 Chronic kidney disease, stage 3b: Secondary | ICD-10-CM

## 2023-08-01 DIAGNOSIS — I10 Essential (primary) hypertension: Secondary | ICD-10-CM

## 2023-08-01 DIAGNOSIS — E782 Mixed hyperlipidemia: Secondary | ICD-10-CM

## 2023-08-01 MED ORDER — AMLODIPINE BESYLATE 5 MG PO TABS: 5.0000 mg | ORAL_TABLET | Freq: Every day | ORAL | 3 refills | Status: AC

## 2023-08-01 NOTE — Assessment & Plan Note (Addendum)
 Ask him to stop lisinopril  because his creatinine was up to 1.96 and EGFR was 34.  Blood pressures running 170s over 80s to 170s over 100 in the office.  Asked him to start hydralazine 25 mg twice daily and he refused.  He does not want to take another medication.  He does not want to increase amlodipine  because of the potential for swelling.  He takes Lasix  20 mg daily for swelling in his left leg now.  Note he takes Lasix  20 mg without a potassium supplement probably because he was on lisinopril  20 mg twice daily.  Will need to watch his potassium level. He finally agreed to increase his blood pressure medication by taking carvedilol  1/2 tablet in the evening.  Asked him to follow-up in a month to measure his blood pressure readings and he refused.  His co-pay is $20 and he does not feel that getting his blood pressure checked is worth the $20.  He will send a message to me via MyChart to tell me what his blood pressures are running.

## 2023-08-01 NOTE — Assessment & Plan Note (Addendum)
 Stopped lisinopril  20 mg twice daily as his creatinine was up to 1.96 and GFR was 34.  Hope to see some improvement in his creatinine and GFR since being off lisinopril  for a week.  Ask him to see a nephrologist and he refuses.  His sister is on dialysis and he would rather die than be on dialysis.  Advised that he could see the nephrologist just to adjust his blood pressure medications and he still refuses.

## 2023-08-01 NOTE — Progress Notes (Signed)
 Established Patient Office Visit  Subjective   Patient ID: Logan [name], male    DOB: 1945-03-23  Age: 78 y.o. MRN: [id number]  Chief Complaint  Patient presents with   Medical Management of Chronic Issues    HPI Delightful 78 year old gentleman establishing care.  He has an aortic aneurysm, CAD (s/p 3-V CABG 10/28/2020), CVA, chronic diastolic heart failure, HTN, mixed hyperlipidemia, hypothyroidism, myalgias to statins, borderline DMT2, stage IIIb CKD (creatinine 1.69, sister with ESRD), previous smoker, vitamin D  deficiency.  Blood test 07/18/2023 creatinine 1.96, EGFR 34.  He was taking lisinopril  20 mg twice daily.  Contacted him and ask him to stop the lisinopril .  Blood pressure has been elevated in the 170s over 80-100 since then.  He checks his blood pressure in the morning before he takes his medication and in the evening when his evening dose of medication is due.  He reports that his blood pressures drop into the 120s over 70s in the middle of the day.  Specifically he reports that his blood pressure drops about an hour after he takes carvedilol  25 mg every morning.  He is currently taking carvedilol  25 mg every morning only, amlodipine  5 mg nightly, Lasix  20 mEq daily.  He does not take a potassium supplement probably because he was on lisinopril  20 mg twice daily. Asked him to check his renal function today and he agreed.  Discussed referral to a nephrologist and he is adamant that he will not go.  His sister is 1 and is on dialysis.  He is not going to see a nephrologist because he does not even want to discuss dialysis.  He refuses to go on dialysis.  Discussed that we need another blood pressure medicine on board to take the place of the lisinopril  and he is adamant that he will not start another blood pressure medication.      ROS    Objective:     BP (!) 174/91   Pulse 72   Temp 98.2 F (36.8 C) (Oral)   Ht 5' 10 (1.778 m)   Wt 269 lb (122 kg)   SpO2 91%   BMI  38.60 kg/m    Physical Exam Vitals and nursing note reviewed.  Constitutional:      Appearance: Normal appearance.  HENT:     Head: Normocephalic and atraumatic.   Eyes:     Conjunctiva/sclera: Conjunctivae normal.    Cardiovascular:     Rate and Rhythm: Normal rate and regular rhythm.  Pulmonary:     Effort: Pulmonary effort is normal.     Breath sounds: Normal breath sounds.   Musculoskeletal:     Right lower leg: No edema.     Left lower leg: Edema (2+ pitting edema) present.   Skin:    General: Skin is warm and dry.   Neurological:     Mental Status: He is alert and oriented to person, place, and time.   Psychiatric:        Mood and Affect: Mood normal.        Behavior: Behavior normal.        Thought Content: Thought content normal.        Judgment: Judgment normal.          No results found for any visits on 08/01/23.    The ASCVD Risk score (Arnett DK, et al., 2019) failed to calculate for the following reasons:   Risk score cannot be calculated because patient has a medical history  suggesting prior/existing ASCVD    Assessment & Plan:  Primary hypertension -     Basic metabolic panel with GFR  Essential hypertension Assessment & Plan: Ask him to stop lisinopril  because his creatinine was up to 1.96 and EGFR was 34.  Blood pressures running 170s over 80s to 170s over 100 in the office.  Asked him to start hydralazine 25 mg twice daily and he refused.  He does not want to take another medication.  He does not want to increase amlodipine  because of the potential for swelling.  He takes Lasix  20 mg daily for swelling in his left leg now.  Note he takes Lasix  20 mg without a potassium supplement probably because he was on lisinopril  20 mg twice daily.  Will need to watch his potassium level. He finally agreed to increase his blood pressure medication by taking carvedilol  1/2 tablet in the evening.  Asked him to follow-up in a month to measure his blood  pressure readings and he refused.  His co-pay is $20 and he does not feel that getting his blood pressure checked is worth the $20.  He will send a message to me via MyChart to tell me what his blood pressures are running.  Orders: -     amLODIPine  Besylate; Take 1 tablet (5 mg total) by mouth daily.  Dispense: 90 tablet; Refill: 3  Stage 3b chronic kidney disease (HCC) Assessment & Plan: Stopped lisinopril  20 mg twice daily as his creatinine was up to 1.96 and GFR was 34.  Hope to see some improvement in his creatinine and GFR since being off lisinopril  for a week.  Ask him to see a nephrologist and he refuses.  His sister is on dialysis and he would rather die than be on dialysis.  Advised that he could see the nephrologist just to adjust his blood pressure medications and he still refuses.   Mixed hyperlipidemia Assessment & Plan: Total cholesterol 204, triglycerides 243, HDL 33 and LDL 128.  He is not taking his statin and will not take one.        Return in about 4 weeks (around 08/29/2023).    Emmajo Bennette K Daneesha Quinteros, MD

## 2023-08-01 NOTE — Assessment & Plan Note (Signed)
 Total cholesterol 204, triglycerides 243, HDL 33 and LDL 128.  He is not taking his statin and will not take one.

## 2023-08-02 ENCOUNTER — Ambulatory Visit: Payer: Self-pay | Admitting: Family Medicine

## 2023-08-02 LAB — BASIC METABOLIC PANEL WITH GFR
BUN/Creatinine Ratio: 14 (ref 10–24)
BUN: 27 mg/dL (ref 8–27)
CO2: 23 mmol/L (ref 20–29)
Calcium: 9.3 mg/dL (ref 8.6–10.2)
Chloride: 103 mmol/L (ref 96–106)
Creatinine, Ser: 1.91 mg/dL — ABNORMAL HIGH (ref 0.76–1.27)
Glucose: 102 mg/dL — ABNORMAL HIGH (ref 70–99)
Potassium: 4.8 mmol/L (ref 3.5–5.2)
Sodium: 140 mmol/L (ref 134–144)
eGFR: 35 mL/min/{1.73_m2} — ABNORMAL LOW (ref >59)

## 2023-09-27 ENCOUNTER — Ambulatory Visit: Payer: Medicare PPO | Admitting: Family Medicine

## 2023-09-27 ENCOUNTER — Encounter: Admitting: Nurse Practitioner
